# Patient Record
Sex: Female | Born: 1950 | Race: White | Hispanic: No | Marital: Married | Smoking: Never smoker
Health system: Southern US, Community
[De-identification: ages and names within clinical notes are randomized; demographics above are authoritative.]

## PROBLEM LIST (undated history)

## (undated) DIAGNOSIS — R7989 Other specified abnormal findings of blood chemistry: Secondary | ICD-10-CM

## (undated) DIAGNOSIS — K589 Irritable bowel syndrome without diarrhea: Secondary | ICD-10-CM

## (undated) DIAGNOSIS — N3281 Overactive bladder: Secondary | ICD-10-CM

## (undated) DIAGNOSIS — K760 Fatty (change of) liver, not elsewhere classified: Secondary | ICD-10-CM

## (undated) DIAGNOSIS — I700 Atherosclerosis of aorta: Secondary | ICD-10-CM

## (undated) DIAGNOSIS — J189 Pneumonia, unspecified organism: Secondary | ICD-10-CM

## (undated) DIAGNOSIS — C4491 Basal cell carcinoma of skin, unspecified: Secondary | ICD-10-CM

## (undated) DIAGNOSIS — E538 Deficiency of other specified B group vitamins: Secondary | ICD-10-CM

## (undated) DIAGNOSIS — I480 Paroxysmal atrial fibrillation: Secondary | ICD-10-CM

## (undated) DIAGNOSIS — M109 Gout, unspecified: Secondary | ICD-10-CM

## (undated) DIAGNOSIS — Z9889 Other specified postprocedural states: Secondary | ICD-10-CM

## (undated) DIAGNOSIS — E0500 Thyrotoxicosis with diffuse goiter without thyrotoxic crisis or storm: Secondary | ICD-10-CM

## (undated) DIAGNOSIS — H269 Unspecified cataract: Secondary | ICD-10-CM

## (undated) DIAGNOSIS — N200 Calculus of kidney: Secondary | ICD-10-CM

## (undated) DIAGNOSIS — E785 Hyperlipidemia, unspecified: Secondary | ICD-10-CM

## (undated) DIAGNOSIS — R609 Edema, unspecified: Secondary | ICD-10-CM

## (undated) DIAGNOSIS — M62830 Muscle spasm of back: Secondary | ICD-10-CM

## (undated) DIAGNOSIS — Z87442 Personal history of urinary calculi: Secondary | ICD-10-CM

## (undated) DIAGNOSIS — R001 Bradycardia, unspecified: Secondary | ICD-10-CM

## (undated) DIAGNOSIS — I10 Essential (primary) hypertension: Secondary | ICD-10-CM

## (undated) DIAGNOSIS — K912 Postsurgical malabsorption, not elsewhere classified: Secondary | ICD-10-CM

## (undated) DIAGNOSIS — N183 Chronic kidney disease, stage 3 unspecified: Secondary | ICD-10-CM

## (undated) DIAGNOSIS — M1990 Unspecified osteoarthritis, unspecified site: Secondary | ICD-10-CM

## (undated) DIAGNOSIS — I209 Angina pectoris, unspecified: Secondary | ICD-10-CM

## (undated) DIAGNOSIS — I4891 Unspecified atrial fibrillation: Secondary | ICD-10-CM

## (undated) DIAGNOSIS — J383 Other diseases of vocal cords: Secondary | ICD-10-CM

## (undated) DIAGNOSIS — K90829 Short bowel syndrome, unspecified: Secondary | ICD-10-CM

## (undated) DIAGNOSIS — R011 Cardiac murmur, unspecified: Secondary | ICD-10-CM

## (undated) DIAGNOSIS — R7303 Prediabetes: Secondary | ICD-10-CM

## (undated) DIAGNOSIS — L719 Rosacea, unspecified: Secondary | ICD-10-CM

## (undated) DIAGNOSIS — I340 Nonrheumatic mitral (valve) insufficiency: Secondary | ICD-10-CM

## (undated) HISTORY — PX: APPENDECTOMY: SHX54

## (undated) HISTORY — PX: REPLACEMENT TOTAL KNEE BILATERAL: SUR1225

## (undated) HISTORY — PX: JOINT REPLACEMENT: SHX530

## (undated) HISTORY — PX: TOTAL KNEE ARTHROPLASTY: SHX125

## (undated) HISTORY — PX: COLON SURGERY: SHX602

## (undated) HISTORY — PX: DILATION AND CURETTAGE OF UTERUS: SHX78

---

## 1995-06-28 HISTORY — PX: VAGINAL HYSTERECTOMY: SUR661

## 1995-06-28 HISTORY — PX: TOTAL VAGINAL HYSTERECTOMY: SHX2548

## 2004-11-30 ENCOUNTER — Ambulatory Visit: Payer: Self-pay | Admitting: Internal Medicine

## 2005-04-29 ENCOUNTER — Ambulatory Visit: Payer: Self-pay | Admitting: Internal Medicine

## 2005-12-14 ENCOUNTER — Ambulatory Visit: Payer: Self-pay | Admitting: Internal Medicine

## 2006-12-19 ENCOUNTER — Ambulatory Visit: Payer: Self-pay | Admitting: Internal Medicine

## 2006-12-26 ENCOUNTER — Ambulatory Visit: Payer: Self-pay | Admitting: Gastroenterology

## 2007-12-25 ENCOUNTER — Ambulatory Visit: Payer: Self-pay | Admitting: Internal Medicine

## 2008-07-31 ENCOUNTER — Observation Stay: Payer: Self-pay | Admitting: Internal Medicine

## 2008-12-25 ENCOUNTER — Ambulatory Visit: Payer: Self-pay | Admitting: Internal Medicine

## 2010-01-04 ENCOUNTER — Ambulatory Visit: Payer: Self-pay | Admitting: Internal Medicine

## 2010-10-27 ENCOUNTER — Ambulatory Visit: Payer: Self-pay | Admitting: Specialist

## 2010-12-27 ENCOUNTER — Emergency Department: Payer: Self-pay | Admitting: Emergency Medicine

## 2011-02-08 ENCOUNTER — Ambulatory Visit: Payer: Self-pay | Admitting: Internal Medicine

## 2012-04-10 ENCOUNTER — Ambulatory Visit: Payer: Self-pay | Admitting: Internal Medicine

## 2012-06-13 ENCOUNTER — Ambulatory Visit: Payer: Self-pay | Admitting: Urology

## 2012-06-28 ENCOUNTER — Ambulatory Visit: Payer: Self-pay | Admitting: Urology

## 2012-07-09 ENCOUNTER — Ambulatory Visit: Payer: Self-pay | Admitting: Urology

## 2012-07-17 ENCOUNTER — Ambulatory Visit: Payer: Self-pay | Admitting: Urology

## 2012-07-24 ENCOUNTER — Ambulatory Visit: Payer: Self-pay | Admitting: Urology

## 2012-10-26 ENCOUNTER — Ambulatory Visit: Payer: Self-pay | Admitting: Urology

## 2012-11-20 ENCOUNTER — Ambulatory Visit: Payer: Self-pay | Admitting: Specialist

## 2012-11-20 LAB — URINALYSIS, COMPLETE
Bilirubin,UR: NEGATIVE
Ph: 5 (ref 4.5–8.0)
Protein: NEGATIVE
RBC,UR: 4 /HPF (ref 0–5)
Specific Gravity: 1.028 (ref 1.003–1.030)
Squamous Epithelial: 1
WBC UR: 2 /HPF (ref 0–5)

## 2012-11-20 LAB — BASIC METABOLIC PANEL
BUN: 23 mg/dL — ABNORMAL HIGH (ref 7–18)
Calcium, Total: 9.4 mg/dL (ref 8.5–10.1)
Co2: 29 mmol/L (ref 21–32)
EGFR (African American): >60
Osmolality: 277 (ref 275–301)
Potassium: 4 mmol/L (ref 3.5–5.1)

## 2012-11-20 LAB — CBC
HCT: 38.4 % (ref 35.0–47.0)
HGB: 13.3 g/dL (ref 12.0–16.0)
MCHC: 34.7 g/dL (ref 32.0–36.0)
Platelet: 238 10*3/uL (ref 150–440)
RDW: 13.4 % (ref 11.5–14.5)

## 2012-11-20 LAB — PROTIME-INR: INR: 0.9

## 2012-12-06 ENCOUNTER — Inpatient Hospital Stay: Payer: Self-pay | Admitting: Specialist

## 2012-12-07 LAB — CBC WITH DIFFERENTIAL/PLATELET
Basophil %: 0.7 %
Eosinophil #: 0.2 10*3/uL (ref 0.0–0.7)
HCT: 33.3 % — ABNORMAL LOW (ref 35.0–47.0)
HGB: 11.4 g/dL — ABNORMAL LOW (ref 12.0–16.0)
Lymphocyte #: 2.5 10*3/uL (ref 1.0–3.6)
Lymphocyte %: 32.8 %
MCH: 31.2 pg (ref 26.0–34.0)
MCHC: 34.4 g/dL (ref 32.0–36.0)
Monocyte #: 0.8 x10 3/mm (ref 0.2–0.9)
Monocyte %: 10.4 %
Neutrophil #: 4 10*3/uL (ref 1.4–6.5)
Neutrophil %: 53.9 %
RBC: 3.67 10*6/uL — ABNORMAL LOW (ref 3.80–5.20)
WBC: 7.5 10*3/uL (ref 3.6–11.0)

## 2012-12-07 LAB — BASIC METABOLIC PANEL
BUN: 11 mg/dL (ref 7–18)
Calcium, Total: 8.3 mg/dL — ABNORMAL LOW (ref 8.5–10.1)
Co2: 27 mmol/L (ref 21–32)
EGFR (African American): >60
Glucose: 91 mg/dL (ref 65–99)
Osmolality: 277 (ref 275–301)
Potassium: 3.8 mmol/L (ref 3.5–5.1)
Sodium: 139 mmol/L (ref 136–145)

## 2013-05-08 ENCOUNTER — Ambulatory Visit: Payer: Self-pay | Admitting: Internal Medicine

## 2013-05-17 ENCOUNTER — Ambulatory Visit: Payer: Self-pay | Admitting: Internal Medicine

## 2013-07-02 ENCOUNTER — Ambulatory Visit: Payer: Self-pay | Admitting: Urology

## 2013-11-14 ENCOUNTER — Ambulatory Visit: Payer: Self-pay | Admitting: Internal Medicine

## 2014-06-02 ENCOUNTER — Ambulatory Visit: Payer: Self-pay | Admitting: Internal Medicine

## 2014-10-17 NOTE — Discharge Summary (Signed)
PATIENT NAME:  Emily Velazquez, Emily Velazquez MR#:  [id number] DATE OF BIRTH:  08/18/1950  DATE OF ADMISSION:  12/06/2012 DATE OF DISCHARGE:  12/09/2012  FINAL DIAGNOSES:  1. Advanced osteoarthritis, left knee.  2. Hypertension and osteoarthritis.   OPERATION: On 12/06/2012, cemented left DePuy rotating platform total knee replacement.   COMPLICATIONS: None.   CONSULTATIONS: None.   DISCHARGE MEDICATIONS: Enteric-coated aspirin 1 p.o. b.i.d., Norco 5/325 p.r.n. pain, home medications as prior to admission.   HISTORY: The patient is a 64 year old female with advanced osteoarthritis of the left knee. It has been coming on for many years. She has had long-term conservative treatment with NSAIDs, cortisone injections, hyaluronic acid injections, walking aids, rest and exercise. X-ray showed advanced arthritis of the knee with medial joint narrowing, sclerosis, cyst formation and patellofemoral spurring. 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 She wished to proceed with left total knee replacement. The risks of surgery of postop complications were discussed with the patient and her husband at length.   PAST MEDICAL HISTORY: As above.   ALLERGIES: SHELLFISH, UNKNOWN. CODEINE AND SULFA DRUGS CAUSE ITCHING.   HOME MEDICATIONS: Oxycodone as needed, Zolpidem 5 mg daily amlodipine 10 mg daily, Zyrtec 10 mg daily, fluticasone 50 mcg nasal spray once a day, atenolol 100 mg once a day, Diovan 1 p.o. daily, estradiol 0.5 mg daily, Relafen twice a day 750 mg, Norco as needed.   REVIEW OF SYSTEMS: Unremarkable.   FAMILY HISTORY: Unremarkable.   SOCIAL HISTORY: The patient does not smoke or drink. She lives at home with her husband.   EXAM:  GENERAL: Healthy female in no distress.  VITAL SIGNS: Normal.  EXTREMITIES: The hips showed normal motion. Right total knee replacement had good motion without pain and good stability. The left knee showed varus deformity with medial joint line pain.  Motion was 10 to 90 degrees. Neurovascular status was good distally. The skin was intact.   LABORATORY DATA: On admission was satisfactory.   HOSPITAL COURSE: The patient underwent left total knee replacement on 12/06/2012. She did well postoperatively. Hemoglobin remained stable, and she was ambulated. She was stable and ready for home discharge on 12/09/2012. She will get home physical therapy. She is partial weightbearing. She will be seen in my office in 2 weeks for exam.    ____________________________ Park Breed, MD hem:gb D: 12/25/2012 18:57:19 ET T: 12/25/2012 21:18:44 ET JOB#: [id number]  cc: Park Breed, MD, <Dictator> Tracie Harrier, MD Park Breed MD ELECTRONICALLY SIGNED 12/26/2012 12:52

## 2014-10-17 NOTE — Op Note (Signed)
PATIENT NAME:  Emily Velazquez, HOLLYWOOD MR#:  [id number] DATE OF BIRTH:  [birth year]  DATE OF PROCEDURE:  12/06/2012  PREOPERATIVE DIAGNOSIS: Advanced osteoarthritis, left knee.   POSTOPERATIVE DIAGNOSIS: Advanced osteoarthritis, left knee.   PROCEDURE PERFORMED: Cemented left DePuy rotating platform total knee replacement (standard plus femur/patella, 10 mm polyethylene insert, #3 keeled tibial tray).   SURGEON: Park Breed, M.D.   ASSISTANT: Christophe Louis, M.D.   ANESTHESIA: Spinal plus femoral nerve block.   COMPLICATIONS: None.   DRAINS: Two Autovac.   ESTIMATED BLOOD LOSS:  25 mL.  REPLACEMENT: None.   DESCRIPTION OF PROCEDURE: The patient was brought to the operating room where she underwent satisfactory spinal anesthesia following a left femoral nerve block. She was placed on the operating table in the supine position and the left leg was prepped and draped in sterile fashion. She was noted to have at least a 10 degree flexion contracture. Esmarch was applied and the tourniquet was inflated to 350 mmHg. Tourniquet time was 96 minutes. An anterior midline incision was made and dissection carried out sharply through subcutaneous tissue. A medial arthrotomy was carried out and soft tissue debridement carried out. The tibial alignment jig was then put in place and the cutting block pinned. The proximal tibial cut was made. This was in good alignment. The femur was sized as a standard plus and the centering hole made here after the ligaments were balanced. Minimal release was done medially. The anterior cutting block was inserted and the femoral rotation guide was put in place and alignment was felt to be excellent. The cutting block was pinned and the anterior and posterior cuts made. A 4 degree valgus left the distal femoral cutting block was put in place and pinned. The distal cut was made. Finishing guide was inserted and the finishing cuts made. The tibia was sized as a #3 and a  centering hole made. Keeled trial was inserted along with a 10 mm bearing. Standard plus femoral trial was inserted and the knee articulated well. She had full extension and good flexion. The patella was cut and drilled and the trial inserted. This tracked well also. Trials were removed and the joint thoroughly irrigated while the cement was mixed. A portion of the 0.25% Marcaine with epinephrine, morphine and Toradol was placed in the posterior capsule and the periosteum of the condyles while this was done.  The #3 keeled tibia, standard plus femur and standard plus patella were all cemented in place and a 10 mm rotating platform bearing was inserted. All excess cement was removed and the knee was held in full extension for 10 minutes. Final clean-up of debris was carried out. Thorough irrigation was carried out. The remainder of the epinephrine, morphine and Toradol was injected. Autovacs were inserted. The capsule was closed with #2 quill and the subcutaneous tissue was closed with zero quill after further irrigation. The skin was closed with staples. Dry sterile dressing along with TENS pads, Polar Care and knee immobilizer were applied. The tourniquet was deflated with good return of blood flow to the foot. Autovac was activated. The patient was transferred to her hospital bed and taken to recovery in good condition. ____________________________ Park Breed, MD hem:sb D: 12/06/2012 10:13:52 ET T: 12/06/2012 11:43:42 ET JOB#: [id number]  cc: Park Breed, MD, <Dictator> Park Breed MD ELECTRONICALLY SIGNED 12/07/2012 13:37

## 2014-10-17 NOTE — H&P (Signed)
   Subjective/Chief Complaint Left knee pain   History of Present Illness 64 year old female with severe progressive osteoarthritis left knee who has failed all conservative treatment including NSAIDs, cortisone shots, hyaluronic acid, walking aids, and rest. X-rays show advanced osteoarthritis of the knee with medial joint narrowing, sclerosis, and cyst formation with patellofemoral spurs.  She wishes to proceed with left total knee replacement as she had done on the right years ago.  Risks and benefits of surgery were discussed at length including but not limited to infection, non union, nerve or blood vessed damage, non union, need for repeat surgery, blood clots and lung emboli, and death.   Past Medical Health Hypertension, osteoarthritis   Primary Physician Hande   ALLERGIES:  Shellfish: Unknown  Codeine: Itching  Sulfa drugs: Itching  HOME MEDICATIONS: Medication Instructions Status  amlodipine 10 mg oral tablet 1 tab(s) orally once a day in am Active  Zyrtec 10 mg oral tablet 1 tab(s) orally once a day in am  Active  fluticasone 50 mcg/inh nasal spray 1 spray(s) nasal once a day, As Needed Active  atenolol 100 mg oral tablet 105 tab(s) orally once a day in am Active  Diovan 320 mg oral tablet 1 tab(s) orally once a day in am Active  estradiol 0.5 mg oral tablet 1 tab(s) orally once a day in am Active  nabumetone 750 mg oral tablet 1 tab(s) orally 2 times a day Active  hydrocodone acetaminophen 5 - 325 1 tab oral BID Active   Family and Social History:  Family History Non-Contributory   Social History negative tobacco   Place of Living Home   Review of Systems:  Fever/Chills No   Cough No   Sputum No   Abdominal Pain No   Physical Exam:  GEN well developed, well nourished, no acute distress   HEENT pink conjunctivae   NECK supple   RESP normal resp effort   CARD regular rate   ABD denies tenderness   LYMPH negative neck   EXTR negative edema, Left knee  range of motion 5-95*,  Tender medial joint line.  slight effusion.  stable ligaments.  circulation/sensation/motor function good.   SKIN normal to palpation   NEURO motor/sensory function intact   PSYCH alert, A+O to time, place, person, good insight    Assessment/Admission Diagnosis Advanced osteoarthritis left knee   Plan Left total knee replacement   Electronic Signatures: Park Breed (MD)  (Signed 11-Jun-14 14:57)  Authored: CHIEF COMPLAINT and HISTORY, ALLERGIES, HOME MEDICATIONS, FAMILY AND SOCIAL HISTORY, REVIEW OF SYSTEMS, PHYSICAL EXAM, ASSESSMENT AND PLAN   Last Updated: 11-Jun-14 14:57 by Park Breed (MD)

## 2015-04-21 ENCOUNTER — Other Ambulatory Visit: Payer: Self-pay | Admitting: Internal Medicine

## 2015-04-21 DIAGNOSIS — Z8742 Personal history of other diseases of the female genital tract: Secondary | ICD-10-CM

## 2015-04-21 DIAGNOSIS — R921 Mammographic calcification found on diagnostic imaging of breast: Secondary | ICD-10-CM

## 2015-04-21 DIAGNOSIS — Z87898 Personal history of other specified conditions: Secondary | ICD-10-CM

## 2015-06-04 ENCOUNTER — Other Ambulatory Visit: Payer: Self-pay

## 2015-06-04 ENCOUNTER — Ambulatory Visit: Payer: Self-pay

## 2015-06-12 ENCOUNTER — Ambulatory Visit
Admission: RE | Admit: 2015-06-12 | Discharge: 2015-06-12 | Disposition: A | Discharge status: Home / Self Care | Payer: Medicare Other | Source: Ambulatory Visit | Attending: Internal Medicine | Admitting: Internal Medicine

## 2015-06-12 ENCOUNTER — Other Ambulatory Visit: Payer: Self-pay | Admitting: Internal Medicine

## 2015-06-12 DIAGNOSIS — Z8742 Personal history of other diseases of the female genital tract: Secondary | ICD-10-CM

## 2015-06-12 DIAGNOSIS — Z87898 Personal history of other specified conditions: Secondary | ICD-10-CM

## 2015-06-12 DIAGNOSIS — R921 Mammographic calcification found on diagnostic imaging of breast: Secondary | ICD-10-CM

## 2015-06-12 DIAGNOSIS — D242 Benign neoplasm of left breast: Secondary | ICD-10-CM | POA: Diagnosis not present

## 2016-05-30 ENCOUNTER — Other Ambulatory Visit: Payer: Self-pay | Admitting: Internal Medicine

## 2016-05-30 DIAGNOSIS — Z1231 Encounter for screening mammogram for malignant neoplasm of breast: Secondary | ICD-10-CM

## 2016-06-02 ENCOUNTER — Ambulatory Visit: Payer: Medicare Other

## 2016-07-05 ENCOUNTER — Ambulatory Visit: Payer: Medicare Other

## 2016-08-02 ENCOUNTER — Ambulatory Visit
Admission: RE | Admit: 2016-08-02 | Discharge: 2016-08-02 | Disposition: A | Discharge status: Home / Self Care | Payer: Medicare Other | Source: Ambulatory Visit | Attending: Internal Medicine | Admitting: Internal Medicine

## 2016-08-02 ENCOUNTER — Encounter: Payer: Self-pay | Admitting: Radiology

## 2016-08-02 DIAGNOSIS — Z1231 Encounter for screening mammogram for malignant neoplasm of breast: Secondary | ICD-10-CM | POA: Insufficient documentation

## 2016-10-25 ENCOUNTER — Encounter: Payer: Self-pay | Admitting: *Deleted

## 2016-10-25 NOTE — H&P (Signed)
See scanned note.

## 2016-10-26 ENCOUNTER — Ambulatory Visit: Payer: Medicare Other | Admitting: Anesthesiology

## 2016-10-26 ENCOUNTER — Ambulatory Visit
Admission: RE | Admit: 2016-10-26 | Discharge: 2016-10-26 | Disposition: A | Discharge status: Home / Self Care | Payer: Medicare Other | Source: Ambulatory Visit | Attending: Ophthalmology | Admitting: Ophthalmology

## 2016-10-26 ENCOUNTER — Encounter
Admission: RE | Disposition: A | Discharge status: Home / Self Care | Payer: Self-pay | Source: Ambulatory Visit | Attending: Ophthalmology

## 2016-10-26 DIAGNOSIS — H2511 Age-related nuclear cataract, right eye: Secondary | ICD-10-CM | POA: Diagnosis not present

## 2016-10-26 DIAGNOSIS — M109 Gout, unspecified: Secondary | ICD-10-CM | POA: Diagnosis not present

## 2016-10-26 DIAGNOSIS — I10 Essential (primary) hypertension: Secondary | ICD-10-CM | POA: Diagnosis not present

## 2016-10-26 DIAGNOSIS — G709 Myoneural disorder, unspecified: Secondary | ICD-10-CM | POA: Insufficient documentation

## 2016-10-26 HISTORY — DX: Essential (primary) hypertension: I10

## 2016-10-26 HISTORY — DX: Personal history of urinary calculi: Z87.442

## 2016-10-26 HISTORY — DX: Rosacea, unspecified: L71.9

## 2016-10-26 HISTORY — DX: Edema, unspecified: R60.9

## 2016-10-26 HISTORY — DX: Gout, unspecified: M10.9

## 2016-10-26 HISTORY — DX: Cardiac murmur, unspecified: R01.1

## 2016-10-26 HISTORY — DX: Overactive bladder: N32.81

## 2016-10-26 HISTORY — PX: CATARACT EXTRACTION W/PHACO: SHX586

## 2016-10-26 HISTORY — DX: Irritable bowel syndrome, unspecified: K58.9

## 2016-10-26 SURGERY — PHACOEMULSIFICATION, CATARACT, WITH IOL INSERTION
Anesthesia: Monitor Anesthesia Care | Site: Eye | Laterality: Right | Wound class: Clean

## 2016-10-26 MED ORDER — MOXIFLOXACIN HCL 0.5 % OP SOLN
1.0000 [drp] | OPHTHALMIC | Status: AC
  Administered 2016-10-26 (×3): 1 [drp] via OPHTHALMIC

## 2016-10-26 MED ORDER — NA CHONDROIT SULF-NA HYALURON 40-17 MG/ML IO SOLN
INTRAOCULAR | Status: AC
  Filled 2016-10-26: qty 1

## 2016-10-26 MED ORDER — EPINEPHRINE PF 1 MG/ML IJ SOLN
INTRAMUSCULAR | Status: AC
  Filled 2016-10-26: qty 2

## 2016-10-26 MED ORDER — CEFUROXIME OPHTHALMIC INJECTION 1 MG/0.1 ML
INJECTION | OPHTHALMIC | Status: DC | PRN
  Administered 2016-10-26: .1 mL via INTRACAMERAL

## 2016-10-26 MED ORDER — MOXIFLOXACIN HCL 0.5 % OP SOLN
OPHTHALMIC | Status: DC | PRN
  Administered 2016-10-26: 1 [drp] via OPHTHALMIC

## 2016-10-26 MED ORDER — BSS IO SOLN
INTRAOCULAR | Status: DC | PRN
  Administered 2016-10-26: 1 mL via OPHTHALMIC

## 2016-10-26 MED ORDER — POVIDONE-IODINE 5 % OP SOLN
OPHTHALMIC | Status: DC | PRN
  Administered 2016-10-26: 1 via OPHTHALMIC

## 2016-10-26 MED ORDER — CARBACHOL 0.01 % IO SOLN
INTRAOCULAR | Status: DC | PRN
  Administered 2016-10-26: .5 mL via INTRAOCULAR

## 2016-10-26 MED ORDER — TETRACAINE HCL 0.5 % OP SOLN
OPHTHALMIC | Status: DC | PRN
  Administered 2016-10-26: 1 [drp] via OPHTHALMIC

## 2016-10-26 MED ORDER — ALFENTANIL 500 MCG/ML IJ INJ
INJECTION | INTRAVENOUS | Status: DC | PRN
  Administered 2016-10-26: 1000 ug via INTRAVENOUS

## 2016-10-26 MED ORDER — PHENYLEPHRINE HCL 10 % OP SOLN
OPHTHALMIC | Status: AC
  Administered 2016-10-26: 1 [drp] via OPHTHALMIC
  Filled 2016-10-26: qty 5

## 2016-10-26 MED ORDER — SODIUM CHLORIDE 0.9 % IV SOLN
INTRAVENOUS | Status: DC
  Administered 2016-10-26: 08:00:00 via INTRAVENOUS

## 2016-10-26 MED ORDER — LIDOCAINE HCL (PF) 4 % IJ SOLN
INTRAOCULAR | Status: DC | PRN
  Administered 2016-10-26: 2 mL via OPHTHALMIC

## 2016-10-26 MED ORDER — LIDOCAINE HCL (PF) 4 % IJ SOLN
INTRAMUSCULAR | Status: DC | PRN
  Administered 2016-10-26: 4 mL via OPHTHALMIC

## 2016-10-26 MED ORDER — PHENYLEPHRINE HCL 10 % OP SOLN
1.0000 [drp] | OPHTHALMIC | Status: DC
  Administered 2016-10-26 (×4): 1 [drp] via OPHTHALMIC

## 2016-10-26 MED ORDER — LIDOCAINE HCL (PF) 4 % IJ SOLN
INTRAMUSCULAR | Status: AC
  Filled 2016-10-26: qty 5

## 2016-10-26 MED ORDER — CYCLOPENTOLATE HCL 2 % OP SOLN
OPHTHALMIC | Status: AC
  Administered 2016-10-26: 1 [drp] via OPHTHALMIC
  Filled 2016-10-26: qty 2

## 2016-10-26 MED ORDER — TETRACAINE HCL 0.5 % OP SOLN
OPHTHALMIC | Status: AC
  Filled 2016-10-26: qty 2

## 2016-10-26 MED ORDER — CEFUROXIME OPHTHALMIC INJECTION 1 MG/0.1 ML
INJECTION | OPHTHALMIC | Status: AC
  Filled 2016-10-26: qty 0.1

## 2016-10-26 MED ORDER — BUPIVACAINE HCL (PF) 0.75 % IJ SOLN
INTRAMUSCULAR | Status: AC
  Filled 2016-10-26: qty 10

## 2016-10-26 MED ORDER — NA CHONDROIT SULF-NA HYALURON 40-17 MG/ML IO SOLN
INTRAOCULAR | Status: DC | PRN
  Administered 2016-10-26: 1 mL via INTRAOCULAR

## 2016-10-26 MED ORDER — HYALURONIDASE HUMAN 150 UNIT/ML IJ SOLN
INTRAMUSCULAR | Status: AC
  Filled 2016-10-26: qty 1

## 2016-10-26 MED ORDER — MOXIFLOXACIN HCL 0.5 % OP SOLN
OPHTHALMIC | Status: AC
  Administered 2016-10-26: 1 [drp] via OPHTHALMIC
  Filled 2016-10-26: qty 3

## 2016-10-26 MED ORDER — POVIDONE-IODINE 5 % OP SOLN
OPHTHALMIC | Status: AC
  Filled 2016-10-26: qty 30

## 2016-10-26 MED ORDER — CYCLOPENTOLATE HCL 2 % OP SOLN
1.0000 [drp] | OPHTHALMIC | Status: AC
  Administered 2016-10-26 (×4): 1 [drp] via OPHTHALMIC

## 2016-10-26 SURGICAL SUPPLY — 30 items
CANNULA ANT/CHMB 27GA (MISCELLANEOUS) ×3 IMPLANT
CORD BIP STRL DISP 12FT (MISCELLANEOUS) ×3 IMPLANT
CUP MEDICINE 2OZ PLAST GRAD ST (MISCELLANEOUS) ×3 IMPLANT
DRAPE XRAY CASSETTE 23X24 (DRAPES) ×3 IMPLANT
ERASER HMR WETFIELD 18G (MISCELLANEOUS) ×3 IMPLANT
GLOVE BIO SURGEON STRL SZ8 (GLOVE) ×3 IMPLANT
GLOVE SURG LX 6.5 MICRO (GLOVE) ×2
GLOVE SURG LX 8.0 MICRO (GLOVE) ×2
GLOVE SURG LX STRL 6.5 MICRO (GLOVE) ×1 IMPLANT
GLOVE SURG LX STRL 8.0 MICRO (GLOVE) ×1 IMPLANT
GOWN STRL REUS W/ TWL LRG LVL3 (GOWN DISPOSABLE) ×1 IMPLANT
GOWN STRL REUS W/ TWL XL LVL3 (GOWN DISPOSABLE) ×1 IMPLANT
GOWN STRL REUS W/TWL LRG LVL3 (GOWN DISPOSABLE) ×2
GOWN STRL REUS W/TWL XL LVL3 (GOWN DISPOSABLE) ×2
LENS IOL ACRYSOF IQ 20.5 (Intraocular Lens) ×3 IMPLANT
PACK CATARACT (MISCELLANEOUS) ×3 IMPLANT
PACK CATARACT DINGLEDEIN LX (MISCELLANEOUS) ×3 IMPLANT
PACK EYE AFTER SURG (MISCELLANEOUS) ×3 IMPLANT
SHLD EYE VISITEC  UNIV (MISCELLANEOUS) ×3 IMPLANT
SOL BSS BAG (MISCELLANEOUS) ×3
SOL PREP PVP 2OZ (MISCELLANEOUS) ×3
SOLUTION BSS BAG (MISCELLANEOUS) ×1 IMPLANT
SOLUTION PREP PVP 2OZ (MISCELLANEOUS) ×1 IMPLANT
SUT ETHILON 10 0 CS140 6 (SUTURE) ×3 IMPLANT
SUT SILK 5-0 (SUTURE) ×3 IMPLANT
SYR 3ML LL SCALE MARK (SYRINGE) ×3 IMPLANT
SYR 5ML LL (SYRINGE) ×3 IMPLANT
SYR TB 1ML 27GX1/2 LL (SYRINGE) ×3 IMPLANT
WATER STERILE IRR 250ML POUR (IV SOLUTION) ×3 IMPLANT
WIPE NON LINTING 3.25X3.25 (MISCELLANEOUS) ×3 IMPLANT

## 2016-10-26 NOTE — Interval H&P Note (Signed)
History and Physical Interval Note:  10/26/2016 10:27 AM  Emily Velazquez  has presented today for surgery, with the diagnosis of CARARACT  The various methods of treatment have been discussed with the patient and family. After consideration of risks, benefits and other options for treatment, the patient has consented to  Procedure(s): CATARACT EXTRACTION PHACO AND INTRAOCULAR LENS PLACEMENT ([place]) (Right) as a surgical intervention .  The patient's history has been reviewed, patient examined, no change in status, stable for surgery.  I have reviewed the patient's chart and labs.  Questions were answered to the patient's satisfaction.     Loray Akard

## 2016-10-26 NOTE — Op Note (Signed)
Date of Surgery: 10/26/2016 Date of Dictation: 10/26/2016 11:16 AM Pre-operative Diagnosis:  Nuclear Sclerotic Cataract right Eye Post-operative Diagnosis: same Procedure performed: Extra-capsular Cataract Extraction (ECCE) with placement of a posterior chamber intraocular lens (IOL) right Eye IOL: * No implants in log * Anesthesia: 2% Lidocaine and 4% Marcaine in a 50/50 mixture with 10 unites/ml of Hylenex given as a peribulbar Anesthesiologist: Anesthesiologist: Andria Frames, MD CRNA: Bernardo Heater, CRNA Complications: none Estimated Blood Loss: less than 1 ml  Description of procedure:  The patient was given anesthesia and sedation via intravenous access. The patient was then prepped and draped in the usual fashion. A 25-gauge needle was bent for initiating the capsulorhexis. A 5-0 silk suture was placed through the conjunctiva superior and inferiorly to serve as bridle sutures. Hemostasis was obtained at the superior limbus using an eraser cautery. A partial thickness groove was made at the anterior surgical limbus with a 64 Beaver blade and this was dissected anteriorly with an [employer]aya. The anterior chamber was entered at 10 o'clock with a 1.0 mm paracentesis knife and through the lamellar dissection with a 2.6 mm Alcon keratome. Epi-Shugarcaine 0.5 CC [9 cc BSS Plus (Alcon), 3 cc 4% preservative-free lidocaine ([place]) and 4 cc 1:1000 preservative-free, bisulfite-free epinephrine] was injected into the anterior chamber via the paracentesis tract. Epi-Shugarcaine 0.5 CC [9 cc BSS Plus (Alcon), 3 cc 4% preservative-free lidocaine (Hospira) and 4 cc 1:1000 preservative-free, bisulfite-free epinephrine] was injected into the anterior chamber via the paracentesis tract. DiscoVisc was injected to replace the aqueous and a continuous tear curvilinear capsulorhexis was performed using a bent 25-gauge needle.  Balance salt on a syringe was used to perform hydro-dissection and  phacoemulsification was carried out using a divide and conquer technique. Procedure(s) with comments: CATARACT EXTRACTION PHACO AND INTRAOCULAR LENS PLACEMENT ([facility]) suture placed in right eye at end of procedure (Right) - [place]  01:21 AP% 23.7 CDE 36.24 Fluid pack lot # [id number]. Irrigation/aspiration was used to remove the residual cortex and the capsular bag was inflated with DiscoVisc. The intraocular lens was inserted into the capsular bag using a pre-loaded UltraSert Delivery System. Irrigation/aspiration was used to remove the residual DiscoVisc. The wound was inflated with balanced salt and checked for leaks. None were found. Miostat was injected via the paracentesis track and 0.1 ml of cefuroxime containing 1 mg of drug  was injected via the paracentesis track. The wound was checked for leaks again and none were found.   The bridal sutures were removed and two drops of Vigamox were placed on the eye. An eye shield was placed to protect the eye and the patient was discharged to the recovery area in good condition.   Demitrious Mccannon MD

## 2016-10-26 NOTE — Transfer of Care (Signed)
Immediate Anesthesia Transfer of Care Note  Patient: Emily Velazquez  Procedure(s) Performed: Procedure(s) with comments: CATARACT EXTRACTION PHACO AND INTRAOCULAR LENS PLACEMENT ([place]) suture placed in right eye at end of procedure (Right) - [place]  01:21 AP% 23.7 CDE 36.24 Fluid pack lot # [id number]  Patient Location: PACU  Anesthesia Type:MAC  Level of Consciousness: awake, alert  and oriented  Airway & Oxygen Therapy: Patient Spontanous Breathing and Patient connected to nasal cannula oxygen  Post-op Assessment: Report given to RN and Post -op Vital signs reviewed and stable  Post vital signs: Reviewed and stable  Last Vitals:  Vitals:   10/26/16 0814 10/26/16 1100  BP: (!) 162/69 135/67  Pulse: 64 (!) 58  Resp: 16 16  Temp: 37.3 C 36.6 C    Last Pain:  Vitals:   10/26/16 0814  TempSrc: Tympanic         Complications: No apparent anesthesia complications

## 2016-10-26 NOTE — Anesthesia Postprocedure Evaluation (Signed)
Anesthesia Post Note  Patient: Emily Velazquez  Procedure(s) Performed: Procedure(s) (LRB): CATARACT EXTRACTION PHACO AND INTRAOCULAR LENS PLACEMENT ([place]) suture placed in right eye at end of procedure (Right)  Patient location during evaluation: PACU Anesthesia Type: MAC Level of consciousness: awake and alert Pain management: pain level controlled Vital Signs Assessment: post-procedure vital signs reviewed and stable Respiratory status: spontaneous breathing, nonlabored ventilation, respiratory function stable and patient connected to nasal cannula oxygen Cardiovascular status: stable and blood pressure returned to baseline Anesthetic complications: no     Last Vitals:  Vitals:   10/26/16 0814 10/26/16 1100  BP: (!) 162/69 135/67  Pulse: 64 (!) 58  Resp: 16 16  Temp: 37.3 C 36.6 C    Last Pain:  Vitals:   10/26/16 0814  TempSrc: Tympanic                 Precious Haws Piscitello

## 2016-10-26 NOTE — OR Nursing (Signed)
Verified patient allergies and medication orders with  Dr Sandra Cockayne no new orders at this time.

## 2016-10-26 NOTE — Anesthesia Post-op Follow-up Note (Cosign Needed)
Anesthesia QCDR form completed.        

## 2016-10-26 NOTE — Anesthesia Preprocedure Evaluation (Signed)
Anesthesia Evaluation  Patient identified by MRN, date of birth, ID band Patient awake    Reviewed: Allergy & Precautions, H&P , NPO status , Patient's Chart, lab work & pertinent test results  History of Anesthesia Complications Negative for: history of anesthetic complications  Airway Mallampati: III  TM Distance: <3 FB Neck ROM: limited    Dental  (+) Chipped, Caps   Pulmonary neg pulmonary ROS, neg shortness of breath,    Pulmonary exam normal breath sounds clear to auscultation       Cardiovascular Exercise Tolerance: Good hypertension, (-) angina(-) Past MI Normal cardiovascular exam+ Valvular Problems/Murmurs  Rhythm:regular Rate:Normal     Neuro/Psych  Neuromuscular disease negative psych ROS   GI/Hepatic negative GI ROS, Neg liver ROS,   Endo/Other  negative endocrine ROS  Renal/GU      Musculoskeletal   Abdominal   Peds  Hematology negative hematology ROS (+)   Anesthesia Other Findings Past Medical History: No date: Edema No date: Gout No date: Heart murmur No date: History of kidney stones No date: Hypertension No date: IBS (irritable bowel syndrome) No date: OAB (overactive bladder) No date: Rosacea No date: Spasm     Comment: VOCAL CORD  Past Surgical History: No date: COLON SURGERY     Comment: RESECTION No date: JOINT REPLACEMENT     Comment: BIL TKR 1997: VAGINAL HYSTERECTOMY     Comment: complete  BMI    Body Mass Index:  35.70 kg/m      Reproductive/Obstetrics negative OB ROS                             Anesthesia Physical Anesthesia Plan  ASA: III  Anesthesia Plan: MAC   Post-op Pain Management:    Induction:   Airway Management Planned: Nasal Cannula  Additional Equipment:   Intra-op Plan:   Post-operative Plan:   Informed Consent: I have reviewed the patients History and Physical, chart, labs and discussed the procedure including the  risks, benefits and alternatives for the proposed anesthesia with the patient or authorized representative who has indicated his/her understanding and acceptance.     Plan Discussed with: Anesthesiologist, CRNA and Surgeon  Anesthesia Plan Comments:         Anesthesia Quick Evaluation

## 2016-10-26 NOTE — Discharge Instructions (Signed)
Eye Surgery Discharge Instructions  Expect mild scratchy sensation or mild soreness. DO NOT RUB YOUR EYE!  The day of surgery:  Minimal physical activity, but bed rest is not required  No reading, computer work, or close hand work  No bending, lifting, or straining.  May watch TV  For 24 hours:  No driving, legal decisions, or alcoholic beverages  Safety precautions  Eat anything you prefer: It is better to start with liquids, then soup then solid foods.  _____ Eye patch should be worn until postoperative exam tomorrow.  ____ Solar shield eyeglasses should be worn for comfort in the sunlight/patch while sleeping  Resume all regular medications including aspirin or Coumadin if these were discontinued prior to surgery. You may shower, bathe, shave, or wash your hair. Tylenol may be taken for mild discomfort.  Call your doctor if you experience significant pain, nausea, or vomiting, fever > 101 or other signs of infection. [phone] or [phone] Specific instructions:  Follow-up Information    Yenifer Saccente, MD Follow up.   Specialty:  Ophthalmology Why:  10-27-16 at 9:50 Contact information: [place] [address] [phone]          Eye Surgery Discharge Instructions  Expect mild scratchy sensation or mild soreness. DO NOT RUB YOUR EYE!  The day of surgery:  Minimal physical activity, but bed rest is not required  No reading, computer work, or close hand work  No bending, lifting, or straining.  May watch TV  For 24 hours:  No driving, legal decisions, or alcoholic beverages  Safety precautions  Eat anything you prefer: It is better to start with liquids, then soup then solid foods.  _____ Eye patch should be worn until postoperative exam tomorrow.  ____ Solar shield eyeglasses should be worn for comfort in the sunlight/patch while sleeping  Resume all regular medications including aspirin or Coumadin if these were  discontinued prior to surgery. You may shower, bathe, shave, or wash your hair. Tylenol may be taken for mild discomfort.  Call your doctor if you experience significant pain, nausea, or vomiting, fever > 101 or other signs of infection. [phone] or [phone] Specific instructions:  Follow-up Information    Miracle Criado, MD Follow up.   Specialty:  Ophthalmology Why:  10-27-16 at 9:50 Contact information: [address]   [place] [address] [phone]

## 2016-10-26 NOTE — Progress Notes (Signed)
No complaints of pain and eye shield dry

## 2017-04-11 DIAGNOSIS — I38 Endocarditis, valve unspecified: Secondary | ICD-10-CM

## 2017-04-11 HISTORY — DX: Endocarditis, valve unspecified: I38

## 2017-05-23 ENCOUNTER — Ambulatory Visit: Payer: Medicare Other | Admitting: Anesthesiology

## 2017-05-23 ENCOUNTER — Ambulatory Visit
Admission: RE | Admit: 2017-05-23 | Discharge: 2017-05-23 | Disposition: A | Discharge status: Home / Self Care | Payer: Medicare Other | Source: Ambulatory Visit | Attending: Internal Medicine | Admitting: Internal Medicine

## 2017-05-23 ENCOUNTER — Encounter: Payer: Self-pay | Admitting: *Deleted

## 2017-05-23 ENCOUNTER — Encounter
Admission: RE | Disposition: A | Discharge status: Home / Self Care | Payer: Self-pay | Source: Ambulatory Visit | Attending: Internal Medicine

## 2017-05-23 DIAGNOSIS — Z888 Allergy status to other drugs, medicaments and biological substances status: Secondary | ICD-10-CM | POA: Diagnosis not present

## 2017-05-23 DIAGNOSIS — Z9849 Cataract extraction status, unspecified eye: Secondary | ICD-10-CM | POA: Insufficient documentation

## 2017-05-23 DIAGNOSIS — Z79890 Hormone replacement therapy (postmenopausal): Secondary | ICD-10-CM | POA: Diagnosis not present

## 2017-05-23 DIAGNOSIS — J309 Allergic rhinitis, unspecified: Secondary | ICD-10-CM | POA: Diagnosis not present

## 2017-05-23 DIAGNOSIS — I480 Paroxysmal atrial fibrillation: Secondary | ICD-10-CM | POA: Diagnosis not present

## 2017-05-23 DIAGNOSIS — Z8249 Family history of ischemic heart disease and other diseases of the circulatory system: Secondary | ICD-10-CM | POA: Insufficient documentation

## 2017-05-23 DIAGNOSIS — Z881 Allergy status to other antibiotic agents status: Secondary | ICD-10-CM | POA: Insufficient documentation

## 2017-05-23 DIAGNOSIS — Z801 Family history of malignant neoplasm of trachea, bronchus and lung: Secondary | ICD-10-CM | POA: Diagnosis not present

## 2017-05-23 DIAGNOSIS — Z7901 Long term (current) use of anticoagulants: Secondary | ICD-10-CM | POA: Insufficient documentation

## 2017-05-23 DIAGNOSIS — E0500 Thyrotoxicosis with diffuse goiter without thyrotoxic crisis or storm: Secondary | ICD-10-CM | POA: Insufficient documentation

## 2017-05-23 DIAGNOSIS — Z87442 Personal history of urinary calculi: Secondary | ICD-10-CM | POA: Diagnosis not present

## 2017-05-23 DIAGNOSIS — Z882 Allergy status to sulfonamides status: Secondary | ICD-10-CM | POA: Diagnosis not present

## 2017-05-23 DIAGNOSIS — Z96653 Presence of artificial knee joint, bilateral: Secondary | ICD-10-CM | POA: Diagnosis not present

## 2017-05-23 DIAGNOSIS — Z9049 Acquired absence of other specified parts of digestive tract: Secondary | ICD-10-CM | POA: Diagnosis not present

## 2017-05-23 DIAGNOSIS — E538 Deficiency of other specified B group vitamins: Secondary | ICD-10-CM | POA: Diagnosis not present

## 2017-05-23 DIAGNOSIS — Z7951 Long term (current) use of inhaled steroids: Secondary | ICD-10-CM | POA: Insufficient documentation

## 2017-05-23 DIAGNOSIS — Z885 Allergy status to narcotic agent status: Secondary | ICD-10-CM | POA: Diagnosis not present

## 2017-05-23 DIAGNOSIS — N3281 Overactive bladder: Secondary | ICD-10-CM | POA: Diagnosis not present

## 2017-05-23 DIAGNOSIS — Z85828 Personal history of other malignant neoplasm of skin: Secondary | ICD-10-CM | POA: Diagnosis not present

## 2017-05-23 DIAGNOSIS — Z79899 Other long term (current) drug therapy: Secondary | ICD-10-CM | POA: Insufficient documentation

## 2017-05-23 DIAGNOSIS — M1990 Unspecified osteoarthritis, unspecified site: Secondary | ICD-10-CM | POA: Diagnosis not present

## 2017-05-23 DIAGNOSIS — Z90710 Acquired absence of both cervix and uterus: Secondary | ICD-10-CM | POA: Insufficient documentation

## 2017-05-23 DIAGNOSIS — I10 Essential (primary) hypertension: Secondary | ICD-10-CM | POA: Insufficient documentation

## 2017-05-23 HISTORY — PX: CARDIOVERSION: EP1203

## 2017-05-23 SURGERY — CARDIOVERSION (CATH LAB)
Anesthesia: General

## 2017-05-23 MED ORDER — PROPOFOL 10 MG/ML IV BOLUS
INTRAVENOUS | Status: AC
  Filled 2017-05-23: qty 20

## 2017-05-23 MED ORDER — METOPROLOL TARTRATE 100 MG PO TABS: 100.0000 mg | ORAL_TABLET | Freq: Two times a day (BID) | ORAL | 4 refills | Status: DC

## 2017-05-23 MED ORDER — ONDANSETRON HCL 4 MG/2ML IJ SOLN: 4.0000 mg | Freq: Once | INTRAMUSCULAR | Status: DC | PRN

## 2017-05-23 MED ORDER — SODIUM CHLORIDE 0.9 % IV SOLN
INTRAVENOUS | Status: DC
  Administered 2017-05-23: 07:00:00 via INTRAVENOUS

## 2017-05-23 MED ORDER — PROPOFOL 10 MG/ML IV BOLUS
INTRAVENOUS | Status: DC | PRN
  Administered 2017-05-23: 70 mg via INTRAVENOUS

## 2017-05-23 MED ORDER — FENTANYL CITRATE (PF) 100 MCG/2ML IJ SOLN: 25.0000 ug | INTRAMUSCULAR | Status: DC | PRN

## 2017-05-23 NOTE — Anesthesia Post-op Follow-up Note (Signed)
Anesthesia QCDR form completed.        

## 2017-05-23 NOTE — CV Procedure (Signed)
Electrical Cardioversion Procedure Note Emily Velazquez [id number] 09-04-1950  Procedure: Electrical Cardioversion Indications:  Atrial Fibrillation  Procedure Details Consent: Risks of procedure as well as the alternatives and risks of each were explained to the (patient/caregiver).  Consent for procedure obtained. Time Out: Verified patient identification, verified procedure, site/side was marked, verified correct patient position, special equipment/implants available, medications/allergies/relevent history reviewed, required imaging and test results available.  Performed  Patient placed on cardiac monitor, pulse oximetry, supplemental oxygen as necessary.  Sedation given: Short-acting barbiturates Pacer pads placed anterior and posterior chest.  Cardioverted 2 time(s).  Cardioverted at 150J.  Evaluation Findings: Post procedure EKG shows: NSR Complications: None Patient did tolerate procedure well.   [name] 05/23/2017, 7:34 AM

## 2017-05-23 NOTE — Transfer of Care (Signed)
Immediate Anesthesia Transfer of Care Note  Patient: Emily Velazquez  Procedure(s) Performed: CARDIOVERSION (N/A )  Patient Location: PACU  Anesthesia Type:General  Level of Consciousness: awake, alert  and oriented  Airway & Oxygen Therapy: Patient Spontanous Breathing and Patient connected to nasal cannula oxygen  Post-op Assessment: Report given to RN and Post -op Vital signs reviewed and stable  Post vital signs: Reviewed and stable  Last Vitals:  Vitals:   05/23/17 0737 05/23/17 0738  BP:  (!) 95/50  Pulse: (!) 51   Resp: 15 15  Temp:    SpO2: 94% 94%    Last Pain: There were no vitals filed for this visit.       Complications: No apparent anesthesia complications

## 2017-05-23 NOTE — Anesthesia Postprocedure Evaluation (Signed)
Anesthesia Post Note  Patient: Emily Velazquez  Procedure(s) Performed: CARDIOVERSION (N/A )  Patient location during evaluation: PACU Anesthesia Type: General Level of consciousness: awake and alert and oriented Pain management: pain level controlled Vital Signs Assessment: post-procedure vital signs reviewed and stable Respiratory status: spontaneous breathing Cardiovascular status: blood pressure returned to baseline Anesthetic complications: no     Last Vitals:  Vitals:   05/23/17 0802 05/23/17 0815  BP: 112/65 (!) 103/56  Pulse: (!) 54 (!) 51  Resp: 17 20  Temp:    SpO2: 98% 98%    Last Pain: There were no vitals filed for this visit.               Miro Balderson

## 2017-05-23 NOTE — Anesthesia Preprocedure Evaluation (Signed)
Anesthesia Evaluation  Patient identified by MRN, date of birth, ID band Patient awake    Reviewed: Allergy & Precautions, H&P , NPO status , Patient's Chart, lab work & pertinent test results, reviewed documented beta blocker date and time   History of Anesthesia Complications Negative for: history of anesthetic complications  Airway Mallampati: III  TM Distance: <3 FB Neck ROM: limited    Dental  (+) Chipped, Caps   Pulmonary neg pulmonary ROS, neg shortness of breath,    Pulmonary exam normal breath sounds clear to auscultation       Cardiovascular Exercise Tolerance: Good hypertension, (-) angina(-) Past MI Normal cardiovascular exam+ dysrhythmias + Valvular Problems/Murmurs  Rhythm:regular Rate:Normal     Neuro/Psych  Neuromuscular disease negative psych ROS   GI/Hepatic negative GI ROS, Neg liver ROS, IBS   Endo/Other  negative endocrine ROS  Renal/GU negative Renal ROS Bladder dysfunction      Musculoskeletal negative musculoskeletal ROS (+)   Abdominal   Peds negative pediatric ROS (+)  Hematology negative hematology ROS (+)   Anesthesia Other Findings Past Medical History: No date: Edema No date: Gout No date: Heart murmur No date: History of kidney stones No date: Hypertension No date: IBS (irritable bowel syndrome) No date: OAB (overactive bladder) No date: Rosacea No date: Spasm     Comment: VOCAL CORD  Past Surgical History: No date: COLON SURGERY     Comment: RESECTION No date: JOINT REPLACEMENT     Comment: BIL TKR 1997: VAGINAL HYSTERECTOMY     Comment: complete  BMI    Body Mass Index:  35.70 kg/m      Reproductive/Obstetrics negative OB ROS                             Anesthesia Physical  Anesthesia Plan  ASA: III  Anesthesia Plan: General   Post-op Pain Management:    Induction: Intravenous  PONV Risk Score and Plan:   Airway Management  Planned: Nasal Cannula  Additional Equipment:   Intra-op Plan:   Post-operative Plan:   Informed Consent: I have reviewed the patients History and Physical, chart, labs and discussed the procedure including the risks, benefits and alternatives for the proposed anesthesia with the patient or authorized representative who has indicated his/her understanding and acceptance.     Plan Discussed with: Anesthesiologist, CRNA and Surgeon  Anesthesia Plan Comments:         Anesthesia Quick Evaluation

## 2017-05-23 NOTE — Discharge Instructions (Signed)
Electrical Cardioversion, Care After  Patient has a follow up for around the 10th of December with Dr. Nehemiah Massed This sheet gives you information about how to care for yourself after your procedure. Your health care provider may also give you more specific instructions. If you have problems or questions, contact your health care provider. What can I expect after the procedure? After the procedure, it is common to have:  Some redness on the skin where the shocks were given.  Follow these instructions at home:  Do not drive for 24 hours if you were given a medicine to help you relax (sedative).  Take over-the-counter and prescription medicines only as told by your health care provider.  Ask your health care provider how to check your pulse. Check it often.  Rest for 48 hours after the procedure or as told by your health care provider.  Avoid or limit your caffeine use as told by your health care provider. Contact a health care provider if:  You feel like your heart is beating too quickly or your pulse is not regular.  You have a serious muscle cramp that does not go away. Get help right away if:  You have discomfort in your chest.  You are dizzy or you feel faint.  You have trouble breathing or you are short of breath.  Your speech is slurred.  You have trouble moving an arm or leg on one side of your body.  Your fingers or toes turn cold or blue. This information is not intended to replace advice given to you by your health care provider. Make sure you discuss any questions you have with your health care provider. Document Released: 04/03/2013 Document Revised: 01/15/2016 Document Reviewed: 12/18/2015 Elsevier Interactive Patient Education  [employer].

## 2017-06-22 ENCOUNTER — Encounter
Admission: RE | Disposition: A | Discharge status: Home / Self Care | Payer: Self-pay | Source: Ambulatory Visit | Attending: Internal Medicine

## 2017-06-22 ENCOUNTER — Ambulatory Visit: Payer: Medicare Other | Admitting: Certified Registered Nurse Anesthetist

## 2017-06-22 ENCOUNTER — Encounter: Payer: Self-pay | Admitting: *Deleted

## 2017-06-22 ENCOUNTER — Ambulatory Visit
Admission: RE | Admit: 2017-06-22 | Discharge: 2017-06-22 | Disposition: A | Discharge status: Home / Self Care | Payer: Medicare Other | Source: Ambulatory Visit | Attending: Internal Medicine | Admitting: Internal Medicine

## 2017-06-22 DIAGNOSIS — I10 Essential (primary) hypertension: Secondary | ICD-10-CM | POA: Diagnosis not present

## 2017-06-22 DIAGNOSIS — Z90710 Acquired absence of both cervix and uterus: Secondary | ICD-10-CM | POA: Diagnosis not present

## 2017-06-22 DIAGNOSIS — Z538 Procedure and treatment not carried out for other reasons: Secondary | ICD-10-CM | POA: Diagnosis not present

## 2017-06-22 DIAGNOSIS — Z96653 Presence of artificial knee joint, bilateral: Secondary | ICD-10-CM | POA: Diagnosis not present

## 2017-06-22 DIAGNOSIS — N3281 Overactive bladder: Secondary | ICD-10-CM | POA: Diagnosis not present

## 2017-06-22 DIAGNOSIS — I4891 Unspecified atrial fibrillation: Secondary | ICD-10-CM | POA: Insufficient documentation

## 2017-06-22 DIAGNOSIS — M109 Gout, unspecified: Secondary | ICD-10-CM | POA: Insufficient documentation

## 2017-06-22 DIAGNOSIS — K589 Irritable bowel syndrome without diarrhea: Secondary | ICD-10-CM | POA: Diagnosis not present

## 2017-06-22 DIAGNOSIS — R011 Cardiac murmur, unspecified: Secondary | ICD-10-CM | POA: Insufficient documentation

## 2017-06-22 HISTORY — PX: CARDIOVERSION: EP1203

## 2017-06-22 SURGERY — CARDIOVERSION (CATH LAB)
Anesthesia: General

## 2017-06-22 MED ORDER — SODIUM CHLORIDE 0.9 % IV SOLN: INTRAVENOUS | Status: DC

## 2017-06-22 NOTE — Addendum Note (Signed)
Addendum  created 06/22/17 7218 by Johnna Acosta, CRNA   Charge Capture section accepted

## 2017-06-22 NOTE — Anesthesia Preprocedure Evaluation (Signed)
Anesthesia Evaluation  Patient identified by MRN, date of birth, ID band Patient awake    Reviewed: Allergy & Precautions, H&P , NPO status , Patient's Chart, lab work & pertinent test results, reviewed documented beta blocker date and time   History of Anesthesia Complications Negative for: history of anesthetic complications  Airway Mallampati: III  TM Distance: <3 FB Neck ROM: limited    Dental  (+) Chipped, Caps   Pulmonary neg pulmonary ROS, neg shortness of breath,    Pulmonary exam normal breath sounds clear to auscultation       Cardiovascular Exercise Tolerance: Good hypertension, (-) angina(-) Past MI Normal cardiovascular exam+ dysrhythmias + Valvular Problems/Murmurs  Rhythm:regular Rate:Normal     Neuro/Psych  Neuromuscular disease negative psych ROS   GI/Hepatic negative GI ROS, Neg liver ROS, IBS   Endo/Other  negative endocrine ROS  Renal/GU negative Renal ROS Bladder dysfunction      Musculoskeletal negative musculoskeletal ROS (+)   Abdominal   Peds negative pediatric ROS (+)  Hematology negative hematology ROS (+)   Anesthesia Other Findings Past Medical History: No date: Edema No date: Gout No date: Heart murmur No date: History of kidney stones No date: Hypertension No date: IBS (irritable bowel syndrome) No date: OAB (overactive bladder) No date: Rosacea No date: Spasm     Comment: VOCAL CORD  Past Surgical History: No date: COLON SURGERY     Comment: RESECTION No date: JOINT REPLACEMENT     Comment: BIL TKR 1997: VAGINAL HYSTERECTOMY     Comment: complete  BMI    Body Mass Index:  35.70 kg/m      Reproductive/Obstetrics negative OB ROS                             Anesthesia Physical  Anesthesia Plan  ASA: III  Anesthesia Plan: General   Post-op Pain Management:    Induction: Intravenous  PONV Risk Score and Plan:   Airway Management  Planned: Nasal Cannula  Additional Equipment:   Intra-op Plan:   Post-operative Plan:   Informed Consent: I have reviewed the patients History and Physical, chart, labs and discussed the procedure including the risks, benefits and alternatives for the proposed anesthesia with the patient or authorized representative who has indicated his/her understanding and acceptance.   Dental Advisory Given  Plan Discussed with: Anesthesiologist, CRNA and Surgeon  Anesthesia Plan Comments: (Patient consented for risks of anesthesia including but not limited to:  - adverse reactions to medications - risk of intubation if required - damage to teeth, lips or other oral mucosa - sore throat or hoarseness - Damage to heart, brain, lungs or loss of life  Patient voiced understanding.)        Anesthesia Quick Evaluation

## 2017-07-13 ENCOUNTER — Other Ambulatory Visit: Payer: Self-pay | Admitting: Internal Medicine

## 2017-07-13 DIAGNOSIS — Z1239 Encounter for other screening for malignant neoplasm of breast: Secondary | ICD-10-CM

## 2017-07-26 NOTE — Op Note (Signed)
The procedure was not done due to patient's spontaneous conversion to normal sinus rhythm

## 2017-08-24 ENCOUNTER — Encounter
Admission: RE | Disposition: A | Discharge status: Home / Self Care | Payer: Self-pay | Source: Ambulatory Visit | Attending: Internal Medicine

## 2017-08-24 ENCOUNTER — Encounter: Payer: Self-pay | Admitting: *Deleted

## 2017-08-24 ENCOUNTER — Ambulatory Visit
Admission: RE | Admit: 2017-08-24 | Discharge: 2017-08-24 | Disposition: A | Discharge status: Home / Self Care | Payer: Medicare Other | Source: Ambulatory Visit | Attending: Internal Medicine | Admitting: Internal Medicine

## 2017-08-24 DIAGNOSIS — R0602 Shortness of breath: Secondary | ICD-10-CM | POA: Insufficient documentation

## 2017-08-24 DIAGNOSIS — Z79899 Other long term (current) drug therapy: Secondary | ICD-10-CM | POA: Insufficient documentation

## 2017-08-24 DIAGNOSIS — Z85828 Personal history of other malignant neoplasm of skin: Secondary | ICD-10-CM | POA: Insufficient documentation

## 2017-08-24 DIAGNOSIS — I10 Essential (primary) hypertension: Secondary | ICD-10-CM | POA: Insufficient documentation

## 2017-08-24 DIAGNOSIS — Z882 Allergy status to sulfonamides status: Secondary | ICD-10-CM | POA: Diagnosis not present

## 2017-08-24 DIAGNOSIS — I340 Nonrheumatic mitral (valve) insufficiency: Secondary | ICD-10-CM | POA: Diagnosis not present

## 2017-08-24 DIAGNOSIS — E0500 Thyrotoxicosis with diffuse goiter without thyrotoxic crisis or storm: Secondary | ICD-10-CM | POA: Insufficient documentation

## 2017-08-24 DIAGNOSIS — Z79890 Hormone replacement therapy (postmenopausal): Secondary | ICD-10-CM | POA: Diagnosis not present

## 2017-08-24 DIAGNOSIS — Z885 Allergy status to narcotic agent status: Secondary | ICD-10-CM | POA: Diagnosis not present

## 2017-08-24 DIAGNOSIS — Z7901 Long term (current) use of anticoagulants: Secondary | ICD-10-CM | POA: Insufficient documentation

## 2017-08-24 DIAGNOSIS — I480 Paroxysmal atrial fibrillation: Secondary | ICD-10-CM | POA: Diagnosis present

## 2017-08-24 DIAGNOSIS — Z888 Allergy status to other drugs, medicaments and biological substances status: Secondary | ICD-10-CM | POA: Diagnosis not present

## 2017-08-24 DIAGNOSIS — Z7951 Long term (current) use of inhaled steroids: Secondary | ICD-10-CM | POA: Diagnosis not present

## 2017-08-24 DIAGNOSIS — Z881 Allergy status to other antibiotic agents status: Secondary | ICD-10-CM | POA: Diagnosis not present

## 2017-08-24 HISTORY — PX: TEE WITHOUT CARDIOVERSION: SHX5443

## 2017-08-24 HISTORY — DX: Muscle spasm of back: M62.830

## 2017-08-24 SURGERY — ECHOCARDIOGRAM, TRANSESOPHAGEAL
Anesthesia: Moderate Sedation

## 2017-08-24 MED ORDER — FENTANYL CITRATE (PF) 100 MCG/2ML IJ SOLN
INTRAMUSCULAR | Status: AC | PRN
  Administered 2017-08-24 (×2): 50 ug via INTRAVENOUS
  Administered 2017-08-24: 25 ug via INTRAVENOUS

## 2017-08-24 MED ORDER — MIDAZOLAM HCL 2 MG/2ML IJ SOLN
INTRAMUSCULAR | Status: AC | PRN
  Administered 2017-08-24 (×2): 2 mg via INTRAVENOUS
  Administered 2017-08-24: 1 mg via INTRAVENOUS

## 2017-08-24 MED ORDER — MIDAZOLAM HCL 5 MG/5ML IJ SOLN
INTRAMUSCULAR | Status: AC
  Filled 2017-08-24: qty 5

## 2017-08-24 MED ORDER — FENTANYL CITRATE (PF) 100 MCG/2ML IJ SOLN
INTRAMUSCULAR | Status: AC
  Filled 2017-08-24: qty 2

## 2017-08-24 MED ORDER — SODIUM CHLORIDE 0.9 % IV SOLN
INTRAVENOUS | Status: DC
  Administered 2017-08-24: 07:00:00 via INTRAVENOUS

## 2017-08-24 MED ORDER — LIDOCAINE VISCOUS 2 % MT SOLN
OROMUCOSAL | Status: AC
  Administered 2017-08-24: 15 mL
  Filled 2017-08-24: qty 15

## 2017-08-24 MED ORDER — BUTAMBEN-TETRACAINE-BENZOCAINE 2-2-14 % EX AERO
INHALATION_SPRAY | CUTANEOUS | Status: AC
  Administered 2017-08-24: 08:00:00 via ORAL
  Filled 2017-08-24: qty 5

## 2017-08-24 MED ORDER — SODIUM CHLORIDE FLUSH 0.9 % IV SOLN
INTRAVENOUS | Status: AC
  Filled 2017-08-24: qty 10

## 2017-08-24 NOTE — Progress Notes (Signed)
Patient clinically stable post TEE per Dr Nehemiah Massed, vitals stable. Remains AFib on monitor from pre procedure. Denies complaints. Husband at bedside. Dr Nehemiah Massed out to speak with patient and husband with questions answered.

## 2017-08-24 NOTE — Progress Notes (Signed)
*  PRELIMINARY RESULTS* Echocardiogram Echocardiogram Transesophageal has been performed.  Emily Velazquez 08/24/2017, 8:32 AM

## 2017-08-24 NOTE — Discharge Instructions (Signed)
Transesophageal Echocardiogram °Transesophageal echocardiography (TEE) is a picture test of your heart using sound waves. The pictures taken can give very detailed pictures of your heart. This can help your doctor see if there are problems with your heart. TEE can check: °· If your heart has blood clots in it. °· How well your heart valves are working. °· If you have an infection on the inside of your heart. °· Some of the major arteries of your heart. °· If your heart valve is working after a repair. °· Your heart before a procedure that uses a shock to your heart to get the rhythm back to normal. ° °What happens before the procedure? °· Do not eat or drink for 6 hours before the procedure or as told by your doctor. °· Make plans to have someone drive you home after the procedure. Do not drive yourself home. °· An IV tube will be put in your arm. °What happens during the procedure? °· You will be given a medicine to help you relax (sedative). It will be given through the IV tube. °· A numbing medicine will be sprayed or gargled in the back of your throat to help numb it. °· The tip of the probe is placed into the back of your mouth. You will be asked to swallow. This helps to pass the probe into your esophagus. °· Once the tip of the probe is in the right place, your doctor can take pictures of your heart. °· You may feel pressure at the back of your throat. °What happens after the procedure? °· You will be taken to a recovery area so the sedative can wear off. °· Your throat may be sore and scratchy. This will go away slowly over time. °· You will go home when you are fully awake and able to swallow liquids. °· You should have someone stay with you for the next 24 hours. °· Do not drive or operate machinery for the next 24 hours. °This information is not intended to replace advice given to you by your health care provider. Make sure you discuss any questions you have with your health care provider. °Document  Released: 04/10/2009 Document Revised: 11/19/2015 Document Reviewed: 12/13/2012 °Elsevier Interactive Patient Education © 2018 Elsevier Inc. ° °

## 2017-08-25 ENCOUNTER — Encounter: Payer: Self-pay | Admitting: Internal Medicine

## 2017-09-07 NOTE — CV Procedure (Signed)
The patient underwent transesophageal echocardiogram without complication  TEE will be further reported in imaging area although patient has normal LV systolic function with moderate mitral regurgitation

## 2017-10-25 HISTORY — PX: ATRIAL FIBRILLATION ABLATION: SHX5732

## 2017-12-06 ENCOUNTER — Ambulatory Visit
Admission: RE | Admit: 2017-12-06 | Discharge: 2017-12-06 | Disposition: A | Discharge status: Home / Self Care | Payer: Medicare Other | Source: Ambulatory Visit | Attending: Internal Medicine | Admitting: Internal Medicine

## 2017-12-06 DIAGNOSIS — Z1231 Encounter for screening mammogram for malignant neoplasm of breast: Secondary | ICD-10-CM | POA: Diagnosis not present

## 2017-12-06 DIAGNOSIS — Z1239 Encounter for other screening for malignant neoplasm of breast: Secondary | ICD-10-CM

## 2018-01-17 ENCOUNTER — Inpatient Hospital Stay
Admission: EM | Admit: 2018-01-17 | Discharge: 2018-01-20 | DRG: 872 | Disposition: A | Discharge status: Home / Self Care | Payer: Medicare Other | Attending: Specialist | Admitting: Specialist

## 2018-01-17 ENCOUNTER — Encounter: Payer: Self-pay | Admitting: Emergency Medicine

## 2018-01-17 ENCOUNTER — Emergency Department: Payer: Medicare Other

## 2018-01-17 DIAGNOSIS — R6520 Severe sepsis without septic shock: Secondary | ICD-10-CM | POA: Diagnosis present

## 2018-01-17 DIAGNOSIS — R402250 Coma scale, best verbal response, oriented, unspecified time: Secondary | ICD-10-CM | POA: Diagnosis present

## 2018-01-17 DIAGNOSIS — N179 Acute kidney failure, unspecified: Secondary | ICD-10-CM | POA: Diagnosis present

## 2018-01-17 DIAGNOSIS — R402360 Coma scale, best motor response, obeys commands, unspecified time: Secondary | ICD-10-CM | POA: Diagnosis present

## 2018-01-17 DIAGNOSIS — A4151 Sepsis due to Escherichia coli [E. coli]: Secondary | ICD-10-CM | POA: Diagnosis present

## 2018-01-17 DIAGNOSIS — N136 Pyonephrosis: Secondary | ICD-10-CM | POA: Diagnosis present

## 2018-01-17 DIAGNOSIS — Z96653 Presence of artificial knee joint, bilateral: Secondary | ICD-10-CM | POA: Diagnosis present

## 2018-01-17 DIAGNOSIS — N200 Calculus of kidney: Secondary | ICD-10-CM

## 2018-01-17 DIAGNOSIS — A419 Sepsis, unspecified organism: Secondary | ICD-10-CM

## 2018-01-17 DIAGNOSIS — Z7901 Long term (current) use of anticoagulants: Secondary | ICD-10-CM

## 2018-01-17 DIAGNOSIS — I10 Essential (primary) hypertension: Secondary | ICD-10-CM | POA: Diagnosis present

## 2018-01-17 DIAGNOSIS — Z885 Allergy status to narcotic agent status: Secondary | ICD-10-CM | POA: Diagnosis not present

## 2018-01-17 DIAGNOSIS — Z79899 Other long term (current) drug therapy: Secondary | ICD-10-CM

## 2018-01-17 DIAGNOSIS — R402140 Coma scale, eyes open, spontaneous, unspecified time: Secondary | ICD-10-CM | POA: Diagnosis present

## 2018-01-17 DIAGNOSIS — I480 Paroxysmal atrial fibrillation: Secondary | ICD-10-CM | POA: Diagnosis present

## 2018-01-17 DIAGNOSIS — Z79890 Hormone replacement therapy (postmenopausal): Secondary | ICD-10-CM

## 2018-01-17 DIAGNOSIS — E876 Hypokalemia: Secondary | ICD-10-CM | POA: Diagnosis present

## 2018-01-17 DIAGNOSIS — Z91040 Latex allergy status: Secondary | ICD-10-CM | POA: Diagnosis not present

## 2018-01-17 DIAGNOSIS — Z888 Allergy status to other drugs, medicaments and biological substances status: Secondary | ICD-10-CM | POA: Diagnosis not present

## 2018-01-17 DIAGNOSIS — Z881 Allergy status to other antibiotic agents status: Secondary | ICD-10-CM | POA: Diagnosis not present

## 2018-01-17 DIAGNOSIS — N1330 Unspecified hydronephrosis: Secondary | ICD-10-CM

## 2018-01-17 DIAGNOSIS — N10 Acute tubulo-interstitial nephritis: Secondary | ICD-10-CM | POA: Diagnosis present

## 2018-01-17 DIAGNOSIS — N12 Tubulo-interstitial nephritis, not specified as acute or chronic: Secondary | ICD-10-CM

## 2018-01-17 DIAGNOSIS — Z882 Allergy status to sulfonamides status: Secondary | ICD-10-CM | POA: Diagnosis not present

## 2018-01-17 HISTORY — DX: Paroxysmal atrial fibrillation: I48.0

## 2018-01-17 HISTORY — PX: IR NEPHROSTOMY PLACEMENT RIGHT: IMG6064

## 2018-01-17 LAB — URINALYSIS, COMPLETE (UACMP) WITH MICROSCOPIC
Bilirubin Urine: NEGATIVE
Glucose, UA: NEGATIVE mg/dL
Ketones, ur: NEGATIVE mg/dL
NITRITE: NEGATIVE
PH: 5 (ref 5.0–8.0)
Protein, ur: 100 mg/dL — AB
SPECIFIC GRAVITY, URINE: 1.018 (ref 1.005–1.030)
WBC, UA: >50 WBC/hpf — ABNORMAL HIGH (ref 0–5)

## 2018-01-17 LAB — CBC
HCT: 38.6 % (ref 35.0–47.0)
Hemoglobin: 13.5 g/dL (ref 12.0–16.0)
MCH: 32 pg (ref 26.0–34.0)
MCHC: 34.9 g/dL (ref 32.0–36.0)
MCV: 91.7 fL (ref 80.0–100.0)
PLATELETS: 164 10*3/uL (ref 150–440)
RBC: 4.21 MIL/uL (ref 3.80–5.20)
RDW: 15 % — AB (ref 11.5–14.5)
WBC: 12.4 10*3/uL — ABNORMAL HIGH (ref 3.6–11.0)

## 2018-01-17 LAB — LACTIC ACID, PLASMA: Lactic Acid, Venous: 1.5 mmol/L (ref 0.5–1.9)

## 2018-01-17 LAB — COMPREHENSIVE METABOLIC PANEL
ALT: 16 U/L (ref 0–44)
AST: 27 U/L (ref 15–41)
Albumin: 3.2 g/dL — ABNORMAL LOW (ref 3.5–5.0)
Alkaline Phosphatase: 68 U/L (ref 38–126)
Anion gap: 11 (ref 5–15)
BILIRUBIN TOTAL: 4.4 mg/dL — AB (ref 0.3–1.2)
BUN: 29 mg/dL — AB (ref 8–23)
CALCIUM: 8.3 mg/dL — AB (ref 8.9–10.3)
CO2: 25 mmol/L (ref 22–32)
CREATININE: 1.91 mg/dL — AB (ref 0.44–1.00)
Chloride: 97 mmol/L — ABNORMAL LOW (ref 98–111)
GFR, EST AFRICAN AMERICAN: 30 mL/min — AB (ref >60)
GFR, EST NON AFRICAN AMERICAN: 26 mL/min — AB (ref >60)
Glucose, Bld: 133 mg/dL — ABNORMAL HIGH (ref 70–99)
Potassium: 2.9 mmol/L — ABNORMAL LOW (ref 3.5–5.1)
Sodium: 133 mmol/L — ABNORMAL LOW (ref 135–145)
TOTAL PROTEIN: 7.6 g/dL (ref 6.5–8.1)

## 2018-01-17 LAB — LIPASE, BLOOD: Lipase: 22 U/L (ref 11–51)

## 2018-01-17 LAB — PROCALCITONIN: PROCALCITONIN: 4.09 ng/mL

## 2018-01-17 MED ORDER — ONDANSETRON 4 MG PO TBDP
4.0000 mg | ORAL_TABLET | Freq: Once | ORAL | Status: AC | PRN
  Administered 2018-01-17: 4 mg via ORAL
  Filled 2018-01-17: qty 1

## 2018-01-17 MED ORDER — IOPAMIDOL (ISOVUE-300) INJECTION 61%
30.0000 mL | Freq: Once | INTRAVENOUS | Status: AC | PRN
  Administered 2018-01-17: 10 mL

## 2018-01-17 MED ORDER — SODIUM CHLORIDE 0.9 % IV BOLUS
1000.0000 mL | Freq: Once | INTRAVENOUS | Status: AC
  Administered 2018-01-17: 1000 mL via INTRAVENOUS

## 2018-01-17 MED ORDER — SODIUM CHLORIDE 0.9 % IV SOLN
1.0000 g | Freq: Once | INTRAVENOUS | Status: AC
  Administered 2018-01-17: 1 g via INTRAVENOUS
  Filled 2018-01-17: qty 10

## 2018-01-17 MED ORDER — FENTANYL CITRATE (PF) 100 MCG/2ML IJ SOLN
100.0000 ug | Freq: Once | INTRAMUSCULAR | Status: AC
  Administered 2018-01-17: 100 ug via INTRAVENOUS
  Filled 2018-01-17: qty 2

## 2018-01-17 MED ORDER — FENTANYL CITRATE (PF) 100 MCG/2ML IJ SOLN
INTRAMUSCULAR | Status: AC
  Filled 2018-01-17: qty 2

## 2018-01-17 MED ORDER — HALOPERIDOL LACTATE 5 MG/ML IJ SOLN
2.5000 mg | Freq: Once | INTRAMUSCULAR | Status: AC
  Administered 2018-01-17: 2.5 mg via INTRAVENOUS
  Filled 2018-01-17: qty 1

## 2018-01-17 MED ORDER — SODIUM CHLORIDE 0.9 % IV SOLN
1.0000 g | Freq: Two times a day (BID) | INTRAVENOUS | Status: DC
  Administered 2018-01-18 – 2018-01-20 (×5): 1 g via INTRAVENOUS
  Filled 2018-01-17 (×7): qty 1

## 2018-01-17 MED ORDER — FENTANYL CITRATE (PF) 100 MCG/2ML IJ SOLN
INTRAMUSCULAR | Status: AC | PRN
  Administered 2018-01-17: 50 ug via INTRAVENOUS

## 2018-01-17 MED ORDER — MIDAZOLAM HCL 2 MG/2ML IJ SOLN
INTRAMUSCULAR | Status: AC
  Filled 2018-01-17: qty 2

## 2018-01-17 MED ORDER — LIDOCAINE HCL (PF) 1 % IJ SOLN
INTRAMUSCULAR | Status: AC
Start: 2018-01-17 — End: ?
  Filled 2018-01-17: qty 30

## 2018-01-17 MED ORDER — SODIUM CHLORIDE 0.9 % IV BOLUS: 1000.0000 mL | Freq: Once | INTRAVENOUS | Status: DC

## 2018-01-17 MED ORDER — MIDAZOLAM HCL 2 MG/2ML IJ SOLN
INTRAMUSCULAR | Status: AC | PRN
  Administered 2018-01-17: 1 mg via INTRAVENOUS

## 2018-01-17 NOTE — ED Notes (Signed)
Dr. Jarvis Newcomer at bedside speaking with pt/husband. MD Hassel aware that only 1 blood culture obtained and only 1 IV in place.

## 2018-01-17 NOTE — H&P (Signed)
[facility] NAME: Emily Velazquez    MR#:  [id number]  DATE OF BIRTH:  1951/04/18  DATE OF ADMISSION:  01/17/2018  PRIMARY CARE PHYSICIAN: Tracie Harrier, MD   REQUESTING/REFERRING PHYSICIAN: Mable Paris, MD  CHIEF COMPLAINT:   Chief Complaint  Patient presents with  . Abdominal Pain    HISTORY OF PRESENT ILLNESS:  Emily Velazquez  is a 67 y.o. female who presents with 4 days of flank pain.  Patient has a history of kidney stones and has required lithotripsy multiple times in the past.  Here in the ED tonight she is found to meet sepsis criteria, and has a large kidney stone.  Patient meets sepsis criteria.  Urology was contacted by ED physician and patient was taken to IR for nephrostomy drain.  Hospitalist were called for admission  PAST MEDICAL HISTORY:   Past Medical History:  Diagnosis Date  . Back spasm    VOCAL CORD  . Edema   . Gout   . Heart murmur   . History of kidney stones   . Hypertension   . IBS (irritable bowel syndrome)   . OAB (overactive bladder)   . PAF (paroxysmal atrial fibrillation) ([place])   . Rosacea      PAST SURGICAL HISTORY:   Past Surgical History:  Procedure Laterality Date  . CARDIOVERSION N/A 05/23/2017   Procedure: CARDIOVERSION;  Surgeon: [name], MD;  Location: [facility] ORS;  Service: Cardiovascular;  Laterality: N/A;  . CARDIOVERSION N/A 06/22/2017   Procedure: CARDIOVERSION;  Surgeon: [name], MD;  Location: [facility] ORS;  Service: Cardiovascular;  Laterality: N/A;  . CATARACT EXTRACTION W/PHACO Right 10/26/2016   Procedure: CATARACT EXTRACTION PHACO AND INTRAOCULAR LENS PLACEMENT ([place]) suture placed in right eye at end of procedure;  Surgeon: [name], MD;  Location: [facility] ORS;  Service: Ophthalmology;  Laterality: Right;  [place]  01:21 AP% 23.7 CDE 36.24 Fluid pack lot # [id number]  . COLON SURGERY     RESECTION  . JOINT REPLACEMENT     BIL TKR  . REPLACEMENT  TOTAL KNEE BILATERAL    . TEE WITHOUT CARDIOVERSION N/A 08/24/2017   Procedure: TRANSESOPHAGEAL ECHOCARDIOGRAM (TEE);  Surgeon: [name], MD;  Location: [facility] ORS;  Service: Cardiovascular;  Laterality: N/A;  . VAGINAL HYSTERECTOMY  1997   complete     SOCIAL HISTORY:   Social History   Tobacco Use  . Smoking status: Never Smoker  . Smokeless tobacco: Never Used  Substance Use Topics  . Alcohol use: No     FAMILY HISTORY:   Family History  Problem Relation Age of Onset  . Breast cancer Daughter 16     DRUG ALLERGIES:   Allergies  Allergen Reactions  . Ace Inhibitors Itching  . Codeine Sulfate Itching        . Latex Itching and Other (See Comments)    Gloves   . Levofloxacin Other (See Comments)    Gastritis.  Onset 08/03/1999.  Marland Kitchen Shellfish-Derived Products Other (See Comments)    Unknown, showed up on allergy test  . Sulfa Antibiotics Itching    MEDICATIONS AT HOME:   Prior to Admission medications   Medication Sig Start Date End Date Taking? Authorizing Provider  acetaminophen (TYLENOL) 500 MG tablet Take 1,000 mg by mouth 2 (two) times daily as needed for mild pain.   Yes [provider]  amiodarone (PACERONE) 200 MG tablet Take 200 mg by mouth daily. 10/26/17  Yes [provider]  amLODipine (NORVASC) 5 MG tablet Take 5 mg daily by mouth.   Yes [provider]  apixaban (ELIQUIS) 5 MG TABS tablet Take 5 mg 2 (two) times daily by mouth.   Yes [provider]  estradiol (ESTRACE) 0.5 MG tablet Take 0.5 mg by mouth daily. 09/04/16  Yes [provider]  metoprolol tartrate (LOPRESSOR) 100 MG tablet Take 1 tablet (100 mg total) by mouth 2 (two) times daily. One half tablet twice per day 05/23/17  Yes [name], MD    REVIEW OF SYSTEMS:  Review of Systems  Constitutional: Positive for chills, fever and malaise/fatigue. Negative for weight loss.  HENT: Negative for ear pain, hearing loss and tinnitus.    Eyes: Negative for blurred vision, double vision, pain and redness.  Respiratory: Negative for cough, hemoptysis and shortness of breath.   Cardiovascular: Negative for chest pain, palpitations, orthopnea and leg swelling.  Gastrointestinal: Negative for abdominal pain, constipation, diarrhea, nausea and vomiting.  Genitourinary: Positive for dysuria and flank pain. Negative for frequency and hematuria.  Musculoskeletal: Negative for back pain, joint pain and neck pain.  Skin:       No acne, rash, or lesions  Neurological: Negative for dizziness, tremors, focal weakness and weakness.  Endo/Heme/Allergies: Negative for polydipsia. Does not bruise/bleed easily.  Psychiatric/Behavioral: Negative for depression. The patient is not nervous/anxious and does not have insomnia.      VITAL SIGNS:   Vitals:   01/17/18 2130 01/17/18 2200 01/17/18 2230 01/17/18 2300  BP: (!) 141/63 (!) 140/59 137/63 136/62  Pulse: (!) 105 96 98 97  Resp: 18 (!) 23 (!) 21 19  Temp: 99 F (37.2 C)     TempSrc: Oral     SpO2: (P) 90% 92% 95% 95%  Weight:      Height:       Wt Readings from Last 3 Encounters:  01/17/18 95.3 kg (210 lb)  08/24/17 94.3 kg (208 lb)  06/22/17 94.3 kg (208 lb)    PHYSICAL EXAMINATION:  Physical Exam  Vitals reviewed. Constitutional: She is oriented to person, place, and time. She appears well-developed and well-nourished. No distress.  HENT:  Head: Normocephalic and atraumatic.  Mouth/Throat: Oropharynx is clear and moist.  Eyes: Pupils are equal, round, and reactive to light. Conjunctivae and EOM are normal. No scleral icterus.  Neck: Normal range of motion. Neck supple. No JVD present. No thyromegaly present.  Cardiovascular: Regular rhythm and intact distal pulses. Exam reveals no gallop and no friction rub.  No murmur heard. Tachycardic  Respiratory: Effort normal and breath sounds normal. No respiratory distress. She has no wheezes. She has no rales.  GI: Soft. Bowel  sounds are normal. She exhibits no distension. There is no tenderness.  Musculoskeletal: Normal range of motion. She exhibits tenderness (Flank tenderness). She exhibits no edema.  No arthritis, no gout  Lymphadenopathy:    She has no cervical adenopathy.  Neurological: She is alert and oriented to person, place, and time. No cranial nerve deficit.  No dysarthria, no aphasia  Skin: Skin is warm and dry. No rash noted. No erythema.  Psychiatric: She has a normal mood and affect. Her behavior is normal. Judgment and thought content normal.    LABORATORY PANEL:   CBC Recent Labs  Lab 01/17/18 1729  WBC 12.4*  HGB 13.5  HCT 38.6  PLT 164   ------------------------------------------------------------------------------------------------------------------  Chemistries  Recent Labs  Lab 01/17/18 1729  NA 133*  K 2.9*  CL 97*  CO2 25  GLUCOSE 133*  BUN 29*  CREATININE 1.91*  CALCIUM 8.3*  AST 27  ALT 16  ALKPHOS 68  BILITOT 4.4*   ------------------------------------------------------------------------------------------------------------------  Cardiac Enzymes No results for input(s): TROPONINI in the last 168 hours. ------------------------------------------------------------------------------------------------------------------  RADIOLOGY:  Ct Abdomen Pelvis Wo Contrast  Result Date: 01/17/2018 CLINICAL DATA:  Abdominal pain with nausea and vomiting and diarrhea EXAM: CT ABDOMEN AND PELVIS WITHOUT CONTRAST TECHNIQUE: Multidetector CT imaging of the abdomen and pelvis was performed following the standard protocol without IV contrast. COMPARISON:  Radiograph 07/02/2013, CT 10/27/2010 FINDINGS: Lower chest: Lung bases demonstrate atelectasis at the left base. No pleural effusion. Borderline cardiomegaly. Small distal esophageal hiatal hernia. Hepatobiliary: No focal liver abnormality is seen. No gallstones, gallbladder wall thickening, or biliary dilatation. Pancreas:  Unremarkable. No pancreatic ductal dilatation or surrounding inflammatory changes. Spleen: Normal in size without focal abnormality. Adrenals/Urinary Tract: Adrenal glands are within normal limits. Enlarged right kidney with moderate to marked right perinephric fat stranding. Multiple calcified stones within the right kidney including a 2.3 cm stone in the right renal pelvis, causing mild right hydronephrosis. Small foci of air in the bladder. Stomach/Bowel: Stomach nonenlarged. Postsurgical changes of the transverse colon. No bowel wall thickening. Vascular/Lymphatic: Nonaneurysmal aorta. Small retroperitoneal lymph nodes measuring up to 8 mm. Reproductive: Status post hysterectomy. No adnexal masses. Other: Negative for free air. Trace fluid in the pelvis. Edema and soft tissue stranding in the right retroperitoneum. Musculoskeletal: No acute or suspicious abnormality. Probable bone island at T11. IMPRESSION: 1. Moderate right perinephric fat stranding with mild right hydronephrosis. This appears to be secondary to a 2.4 cm stone within the right renal pelvis, encroaching the right UPJ. There are multiple additional stones present within the right kidney. 2. Small foci of air within the bladder, this may be due to recent manipulation. Electronically Signed   By: Donavan Foil M.D.   On: 01/17/2018 19:00    EKG:   Orders placed or performed during the hospital encounter of 01/17/18  . ED EKG  . ED EKG  . EKG 12-Lead  . EKG 12-Lead  . ED EKG 12-Lead  . ED EKG 12-Lead    IMPRESSION AND PLAN:  Principal Problem:   Severe sepsis ([place]) -due to pyelonephritis from UTI with hydronephrosis and kidney stone.  She had nephrostomy drain placed tonight.  Urology consult in place.  IV antibiotics started, lactic acid is within normal limits and the patient is hemodynamically stable.  Cultures sent. Active Problems:   Right kidney stone -urology consult as above   Acute pyelonephritis -IV antibiotics, culture  sent, nephrostomy drain placed   AKI (acute kidney injury) ([place]) -due to pyelonephritis, kidney stone, avoid nephrotoxins and monitor   HTN (hypertension) -continue home meds   PAF (paroxysmal atrial fibrillation) ([place]) -continue home meds except for Eliquis.  We will hold this for now until it is clear what surgical route patient may take.  She has not taken it for the last 3 days anyway due to nausea and poor p.o. intake.  Chart review performed and case discussed with ED provider. Labs, imaging and/or ECG reviewed by provider and discussed with patient/family. Management plans discussed with the patient and/or family.  DVT PROPHYLAXIS: Systemic anticoagulation  GI PROPHYLAXIS: None  ADMISSION STATUS: Inpatient  CODE STATUS: Full  TOTAL TIME TAKING CARE OF THIS PATIENT: 45 minutes.   Eshawn Coor [place] [employer] 01/17/2018, 11:19 PM  [employer]  [phone]  CC: Primary care  physician; Tracie Harrier, MD  Note:  This document was prepared using Dragon voice recognition software and may include unintentional dictation errors.

## 2018-01-17 NOTE — ED Notes (Addendum)
Pt resting at bedside, appears comfortable. Denies any needs at this time. Husband at bedside. Nephrostomy tube on R side. Red drainage noted at this time.

## 2018-01-17 NOTE — ED Notes (Signed)
Pt returned from IR. Report received from [facility]

## 2018-01-17 NOTE — ED Provider Notes (Addendum)
[facility] Emergency Department Provider Note  ____________________________________________   First MD Initiated Contact with Patient 01/17/18 1800     (approximate)  I have reviewed the triage vital signs and the nursing notes.   HISTORY  Chief Complaint Abdominal Pain    HPI [name] is a 67 y.o. female who self presents to the emergency department with abdominal pain nausea vomiting and diarrhea for the past 3 days.  The patient's symptoms began gradually and have been slowly progressive are now constant.  Her pain is moderate to severe cramping.  Nothing particular seems to make it better or worse.  She did have a remote hysterectomy that was complicated by a bowel injury requiring bowel resection in the 90s.  The patient reports some dysuria.  She denies fevers but does report chills.  Her pain is nonradiating.   Past Medical History:  Diagnosis Date  . Back spasm    VOCAL CORD  . Edema   . Gout   . Heart murmur   . History of kidney stones   . Hypertension   . IBS (irritable bowel syndrome)   . OAB (overactive bladder)   . Rosacea     There are no active problems to display for this patient.   Past Surgical History:  Procedure Laterality Date  . CARDIOVERSION N/A 05/23/2017   Procedure: CARDIOVERSION;  Surgeon: [name], MD;  Location: [facility] ORS;  Service: Cardiovascular;  Laterality: N/A;  . CARDIOVERSION N/A 06/22/2017   Procedure: CARDIOVERSION;  Surgeon: [name], MD;  Location: [facility] ORS;  Service: Cardiovascular;  Laterality: N/A;  . CATARACT EXTRACTION W/PHACO Right 10/26/2016   Procedure: CATARACT EXTRACTION PHACO AND INTRAOCULAR LENS PLACEMENT ([employer]) suture placed in right eye at end of procedure;  Surgeon: [name], MD;  Location: [facility] ORS;  Service: Ophthalmology;  Laterality: Right;  [place]  01:21 AP% 23.7 CDE 36.24 Fluid pack lot # [id number]  . COLON SURGERY     RESECTION  . JOINT REPLACEMENT     BIL TKR  . REPLACEMENT TOTAL KNEE BILATERAL    . TEE WITHOUT CARDIOVERSION N/A 08/24/2017   Procedure: TRANSESOPHAGEAL ECHOCARDIOGRAM (TEE);  Surgeon: [name], MD;  Location: [facility] ORS;  Service: Cardiovascular;  Laterality: N/A;  . Brush   complete    Prior to Admission medications   Medication Sig Start Date End Date Taking? Authorizing Provider  acetaminophen (TYLENOL) 500 MG tablet Take 1,000 mg by mouth 2 (two) times daily as needed for mild pain.    [provider]  amLODipine (NORVASC) 5 MG tablet Take 5 mg daily by mouth.    [provider]  apixaban (ELIQUIS) 5 MG TABS tablet Take 5 mg 2 (two) times daily by mouth.    [provider]  estradiol (ESTRACE) 0.5 MG tablet Take 0.5 mg by mouth daily. 09/04/16   [provider]  metoprolol tartrate (LOPRESSOR) 100 MG tablet Take 1 tablet (100 mg total) by mouth 2 (two) times daily. One half tablet twice per day Patient taking differently: Take 150 mg by mouth 2 (two) times daily. One half tablet twice per day 05/23/17   [name], MD  oxyCODONE-acetaminophen (PERCOCET/ROXICET) 5-325 MG tablet Take 1 tablet by mouth every 4 (four) hours as needed for severe pain (for kidney stone pain if needed.).     [provider]    Allergies Ace inhibitors; Codeine sulfate; Latex; Levofloxacin; Shellfish-derived products; and Sulfa antibiotics  Family  History  Problem Relation Age of Onset  . Breast cancer Daughter 72    Social History Social History   Tobacco Use  . Smoking status: Never Smoker  . Smokeless tobacco: Never Used  Substance Use Topics  . Alcohol use: No  . Drug use: No    Review of Systems Constitutional: No fever/chills Eyes: No visual changes. ENT: No sore throat. Cardiovascular: Denies chest pain. Respiratory: Denies shortness of breath. Gastrointestinal: Positive for abdominal pain.  Positive for nausea, positive for vomiting.   Positive for diarrhea.  No constipation. Genitourinary: Positive for dysuria. Musculoskeletal: Negative for back pain. Skin: Negative for rash. Neurological: Negative for headaches, focal weakness or numbness.   ____________________________________________   PHYSICAL EXAM:  VITAL SIGNS: ED Triage Vitals  Enc Vitals Group     BP 01/17/18 1720 (!) 148/71     Pulse Rate 01/17/18 1720 (!) 133     Resp 01/17/18 1720 20     Temp 01/17/18 1720 98.5 F (36.9 C)     Temp Source 01/17/18 1720 Oral     SpO2 01/17/18 1720 97 %     Weight 01/17/18 1721 210 lb (95.3 kg)     Height 01/17/18 1721 5\' 3"  (1.6 m)     Head Circumference --      Peak Flow --      Pain Score 01/17/18 1720 8     Pain Loc --      Pain Edu? --      Excl. in [place]? --     Constitutional: Alert and oriented x4 appears uncomfortable holding her abdomen tearful mild diaphoresis Eyes: PERRL EOMI. Head: Atraumatic. Nose: No congestion/rhinnorhea. Mouth/Throat: No trismus Neck: No stridor.   Cardiovascular: Tachycardic rate, regular rhythm. Grossly normal heart sounds.  Good peripheral circulation. Respiratory: Increased respiratory effort.  No retractions. Lungs CTAB and moving good air Gastrointestinal: Soft diffusely tender abdomen with no rebound tenderness although no frank peritonitis.  No costovertebral tenderness Musculoskeletal: No lower extremity edema   Neurologic:  Normal speech and language. No gross focal neurologic deficits are appreciated. Skin: Mildly diaphoretic Psychiatric: Mood and affect are normal. Speech and behavior are normal.    ____________________________________________   DIFFERENTIAL includes but not limited to  Small bowel obstruction, volvulus, urinary tract infection, pyelonephritis, nephrolithiasis ____________________________________________   LABS (all labs ordered are listed, but only abnormal results are displayed)  Labs Reviewed  COMPREHENSIVE METABOLIC PANEL - Abnormal;  Notable for the following components:      Result Value   Sodium 133 (*)    Potassium 2.9 (*)    Chloride 97 (*)    Glucose, Bld 133 (*)    BUN 29 (*)    Creatinine, Ser 1.91 (*)    Calcium 8.3 (*)    Albumin 3.2 (*)    Total Bilirubin 4.4 (*)    GFR calc non Af Amer 26 (*)    GFR calc Af Amer 30 (*)    All other components within normal limits  CBC - Abnormal; Notable for the following components:   WBC 12.4 (*)    RDW 15.0 (*)    All other components within normal limits  URINALYSIS, COMPLETE (UACMP) WITH MICROSCOPIC - Abnormal; Notable for the following components:   Color, Urine AMBER (*)    APPearance CLOUDY (*)    Hgb urine dipstick MODERATE (*)    Protein, ur 100 (*)    Leukocytes, UA MODERATE (*)    WBC, UA >50 (*)  Bacteria, UA RARE (*)    All other components within normal limits  URINE CULTURE  CULTURE, BLOOD (ROUTINE X 2)  CULTURE, BLOOD (ROUTINE X 2)  LIPASE, BLOOD  LACTIC ACID, PLASMA  LACTIC ACID, PLASMA  PROCALCITONIN    Work reviewed by me shows acute kidney injury along with elevated white count concerning for infection.  Urinalysis is consistent with infection __________________________________________  EKG  ED ECG REPORT I, Darel Hong, the attending physician, personally viewed and interpreted this ECG.  Date: 01/17/2018 EKG Time:  Rate: 129 Rhythm: normal sinus rhythm QRS Axis: normal Intervals: normal ST/T Wave abnormalities: normal Narrative Interpretation: no evidence of acute ischemia  ____________________________________________  RADIOLOGY  CT abdomen pelvis without contrast reviewed by me with large 2.3 cm right sided kidney stone along with gas in the bladder concerning for infection ____________________________________________   PROCEDURES  Procedure(s) performed: no  .Critical Care Performed by: Darel Hong, MD Authorized by: Darel Hong, MD   Critical care provider statement:    Critical care time  (minutes):  45   Critical care time was exclusive of:  Separately billable procedures and treating other patients   Critical care was necessary to treat or prevent imminent or life-threatening deterioration of the following conditions:  Sepsis   Critical care was time spent personally by me on the following activities:  Development of treatment plan with patient or surrogate, discussions with consultants, evaluation of patient's response to treatment, examination of patient, obtaining history from patient or surrogate, ordering and performing treatments and interventions, ordering and review of laboratory studies, ordering and review of radiographic studies, pulse oximetry, re-evaluation of patient's condition and review of old charts    Critical Care performed: Yes  ____________________________________________   INITIAL IMPRESSION / ASSESSMENT AND PLAN / ED COURSE  Pertinent labs & imaging results that were available during my care of the patient were reviewed by me and considered in my medical decision making (see chart for details).   As part of my medical decision making, I reviewed the following data within the [facility] History obtained from family if available, nursing notes, old chart and ekg, as well as notes from prior ED visits.  The patient arrives quite uncomfortable appearing and is tachycardic.  She is diffusely tender and requires a CT scan of her abdomen and pelvis.  IV fentanyl and haloperidol for pain and nausea now.  Unfortunately her renal function is such that we cannot use IV contrast.    The patient's CT scan is back showing a 2.3 cm right sided kidney stone along with some gas in her bladder.  Had recent instrumentation and on reevaluation her skin is quite warm despite her normal oral temperature.  I personally performed a rectal temperature chaperoned by female nurse Anderson Malta and she is 100.9 degrees raising concern for pyelonephritis with an  obstructive stone.  I have a call to urology now.  I am adding on IV fluids, blood cultures, and ceftriaxone.  ----------------------------------------- 7:19 PM on 01/17/2018 ----------------------------------------- I spoke with Dr. Erlene Quan who recommends emergent IR consultation for perc nephrostomy tube.  She'll reach out to them now.   Interventional radiology is available and will take the patient emergently to the operating room for percutaneous nephrostomy tube now.  ____________________________________________   FINAL CLINICAL IMPRESSION(S) / ED DIAGNOSES  Final diagnoses:  Kidney stone  Pyelonephritis  Sepsis, due to unspecified organism [facility])      NEW MEDICATIONS STARTED DURING THIS VISIT:  New Prescriptions  No medications on file     Note:  This document was prepared using Dragon voice recognition software and may include unintentional dictation errors.     Darel Hong, MD 01/17/18 2127    Darel Hong, MD 01/17/18 2138

## 2018-01-17 NOTE — Consult Note (Addendum)
Chief Complaint: Patient was seen in consultation today for  Chief Complaint  Patient presents with  . Abdominal Pain   at the request of Hollice Espy   Referring Physician(s): Erlene Quan  Supervising Physician: Arne Cleveland  Patient Status: [facility] - ED  History of Present Illness: Emily Velazquez is a 67 y.o. female  H/o nephrolithiasis, presents to ED with abd pain, N&V. H/o a fib post ablation 3 mo ago. CT shows large stone centrally in R renal collecting system, mild hydronephrosis, adjacent inflammatory change.   Past Medical History:  Diagnosis Date  . Back spasm    VOCAL CORD  . Edema   . Gout   . Heart murmur   . History of kidney stones   . Hypertension   . IBS (irritable bowel syndrome)   . OAB (overactive bladder)   . Rosacea     Past Surgical History:  Procedure Laterality Date  . CARDIOVERSION N/A 05/23/2017   Procedure: CARDIOVERSION;  Surgeon: [name], MD;  Location: [facility] ORS;  Service: Cardiovascular;  Laterality: N/A;  . CARDIOVERSION N/A 06/22/2017   Procedure: CARDIOVERSION;  Surgeon: [name], MD;  Location: [facility] ORS;  Service: Cardiovascular;  Laterality: N/A;  . CATARACT EXTRACTION W/PHACO Right 10/26/2016   Procedure: CATARACT EXTRACTION PHACO AND INTRAOCULAR LENS PLACEMENT ([place]) suture placed in right eye at end of procedure;  Surgeon: [name], MD;  Location: [facility] ORS;  Service: Ophthalmology;  Laterality: Right;  [place]  01:21 AP% 23.7 CDE 36.24 Fluid pack lot # [id number]  . COLON SURGERY     RESECTION  . JOINT REPLACEMENT     BIL TKR  . REPLACEMENT TOTAL KNEE BILATERAL    . TEE WITHOUT CARDIOVERSION N/A 08/24/2017   Procedure: TRANSESOPHAGEAL ECHOCARDIOGRAM (TEE);  Surgeon: [name], MD;  Location: [facility] ORS;  Service: Cardiovascular;  Laterality: N/A;  . VAGINAL HYSTERECTOMY  1997   complete    Allergies: Ace inhibitors; Codeine sulfate; Latex; Levofloxacin; Shellfish-derived products; and Sulfa  antibiotics  Medications: Prior to Admission medications   Medication Sig Start Date End Date Taking? Authorizing Provider  acetaminophen (TYLENOL) 500 MG tablet Take 1,000 mg by mouth 2 (two) times daily as needed for mild pain.   Yes [provider]  amiodarone (PACERONE) 200 MG tablet Take 200 mg by mouth daily. 10/26/17  Yes [provider]  amLODipine (NORVASC) 5 MG tablet Take 5 mg daily by mouth.   Yes [provider]  apixaban (ELIQUIS) 5 MG TABS tablet Take 5 mg 2 (two) times daily by mouth.   Yes [provider]  estradiol (ESTRACE) 0.5 MG tablet Take 0.5 mg by mouth daily. 09/04/16  Yes [provider]  metoprolol tartrate (LOPRESSOR) 100 MG tablet Take 1 tablet (100 mg total) by mouth 2 (two) times daily. One half tablet twice per day 05/23/17  Yes [name], MD     Family History  Problem Relation Age of Onset  . Breast cancer Daughter 41    Social History   Socioeconomic History  . Marital status: Married    Spouse name: Not on file  . Number of children: Not on file  . Years of education: Not on file  . Highest education level: Not on file  Occupational History  . Not on file  Social Needs  . Financial resource strain: Not on file  . Food insecurity:    Worry: Not on file    Inability: Not on  file  . Transportation needs:    Medical: Not on file    Non-medical: Not on file  Tobacco Use  . Smoking status: Never Smoker  . Smokeless tobacco: Never Used  Substance and Sexual Activity  . Alcohol use: No  . Drug use: No  . Sexual activity: Not on file  Lifestyle  . Physical activity:    Days per week: Not on file    Minutes per session: Not on file  . Stress: Not on file  Relationships  . Social connections:    Talks on phone: Not on file    Gets together: Not on file    Attends religious service: Not on file    Active member of club or organization: Not on file    Attends meetings of clubs or  organizations: Not on file    Relationship status: Not on file  Other Topics Concern  . Not on file  Social History Narrative  . Not on file    ECOG Status: 2 - Symptomatic, <50% confined to bed  Review of Systems: A 12 point ROS discussed and pertinent positives are indicated in the HPI above.  All other systems are negative.  Review of Systems  Vital Signs: BP 136/64   Pulse (!) 107   Temp (!) 100.9 F (38.3 C) (Rectal)   Resp (!) 22   Ht 5\' 3"  (1.6 m)   Wt 210 lb (95.3 kg)   SpO2 97%   BMI 37.20 kg/m   Physical Exam. Constitutional: Oriented to person, place, and time. Well-developed and well-nourished. No distress.  HENT:  Head: Normocephalic and atraumatic.  Eyes: Conjunctivae and EOM are normal. Right eye exhibits no discharge. Left eye exhibits no discharge. No scleral icterus.  Neck: No JVD present.  Pulmonary/Chest: Effort normal. No stridor. No respiratory distress.  Abdomen: soft, non distended Neurological:  alert and oriented to person, place, and time.  Skin: Skin is warm and dry.  not diaphoretic.  Psychiatric:   normal mood and affect.   behavior is normal. Judgment and thought content normal.      Imaging: Ct Abdomen Pelvis Wo Contrast  Result Date: 01/17/2018 CLINICAL DATA:  Abdominal pain with nausea and vomiting and diarrhea EXAM: CT ABDOMEN AND PELVIS WITHOUT CONTRAST TECHNIQUE: Multidetector CT imaging of the abdomen and pelvis was performed following the standard protocol without IV contrast. COMPARISON:  Radiograph 07/02/2013, CT 10/27/2010 FINDINGS: Lower chest: Lung bases demonstrate atelectasis at the left base. No pleural effusion. Borderline cardiomegaly. Small distal esophageal hiatal hernia. Hepatobiliary: No focal liver abnormality is seen. No gallstones, gallbladder wall thickening, or biliary dilatation. Pancreas: Unremarkable. No pancreatic ductal dilatation or surrounding inflammatory changes. Spleen: Normal in size without focal  abnormality. Adrenals/Urinary Tract: Adrenal glands are within normal limits. Enlarged right kidney with moderate to marked right perinephric fat stranding. Multiple calcified stones within the right kidney including a 2.3 cm stone in the right renal pelvis, causing mild right hydronephrosis. Small foci of air in the bladder. Stomach/Bowel: Stomach nonenlarged. Postsurgical changes of the transverse colon. No bowel wall thickening. Vascular/Lymphatic: Nonaneurysmal aorta. Small retroperitoneal lymph nodes measuring up to 8 mm. Reproductive: Status post hysterectomy. No adnexal masses. Other: Negative for free air. Trace fluid in the pelvis. Edema and soft tissue stranding in the right retroperitoneum. Musculoskeletal: No acute or suspicious abnormality. Probable bone island at T11. IMPRESSION: 1. Moderate right perinephric fat stranding with mild right hydronephrosis. This appears to be secondary to a 2.4 cm stone within the right  renal pelvis, encroaching the right UPJ. There are multiple additional stones present within the right kidney. 2. Small foci of air within the bladder, this may be due to recent manipulation. Electronically Signed   By: Donavan Foil M.D.   On: 01/17/2018 19:00    Labs:  CBC: Recent Labs    01/17/18 1729  WBC 12.4*  HGB 13.5  HCT 38.6  PLT 164    COAGS: No results for input(s): INR, APTT in the last 8760 hours.  BMP: Recent Labs    01/17/18 1729  NA 133*  K 2.9*  CL 97*  CO2 25  GLUCOSE 133*  BUN 29*  CALCIUM 8.3*  CREATININE 1.91*  GFRNONAA 26*  GFRAA 30*    LIVER FUNCTION TESTS: Recent Labs    01/17/18 1729  BILITOT 4.4*  AST 27  ALT 16  ALKPHOS 68  PROT 7.6  ALBUMIN 3.2*    TUMOR MARKERS: No results for input(s): AFPTM, CEA, CA199, CHROMGRNA in the last 8760 hours.  Assessment and Plan:  Obstructing R kidney stone with possible pyonephrosis. Perc neph would allow decompression as well as access for future stone removal. Discussed with  Dr Erlene Quan. Will proceed with urgent perc neph tonight. DIscussed with pt and spouse who understand and consent.   Thank you for this interesting consult.  I greatly enjoyed meeting SRIJA SOUTHARD and look forward to participating in their care.  A copy of this report was sent to the requesting provider on this date.  Electronically Signed: Rickard Rhymes, MD 01/17/2018, 8:48 PM   I spent a total of  15 Minutes   in face to face in clinical consultation, greater than 50% of which was counseling/coordinating care for right pyonephrosis.

## 2018-01-17 NOTE — ED Notes (Signed)
ED TO INPATIENT HANDOFF REPORT  Name/Age/Gender Emily Velazquez 67 y.o. female  Code Status   Home/SNF/Other Home  Chief Complaint Abdominal Pain   Level of Care/Admitting Diagnosis ED Disposition    ED Disposition Condition [facility] Area: [place] [[id number]]  Level of Care: Med-Surg [16]  Diagnosis: Severe sepsis [facility]) [[id number]]  Admitting Physician: Lance Coon [[id number]]  Attending Physician: Lance Coon [phone]  Estimated length of stay: past midnight tomorrow  Certification:: I certify this patient will need inpatient services for at least 2 midnights  PT Class (Do Not Modify): Inpatient [101]  PT Acc Code (Do Not Modify): Private [1]       Medical History Past Medical History:  Diagnosis Date  . Back spasm    VOCAL CORD  . Edema   . Gout   . Heart murmur   . History of kidney stones   . Hypertension   . IBS (irritable bowel syndrome)   . OAB (overactive bladder)   . PAF (paroxysmal atrial fibrillation) ([place])   . Rosacea     Allergies Allergies  Allergen Reactions  . Ace Inhibitors Itching  . Codeine Sulfate Itching        . Latex Itching and Other (See Comments)    Gloves   . Levofloxacin Other (See Comments)    Gastritis.  Onset 08/03/1999.  Marland Kitchen Shellfish-Derived Products Other (See Comments)    Unknown, showed up on allergy test  . Sulfa Antibiotics Itching    IV Location/Drains/Wounds Patient Lines/Drains/Airways Status   Active Line/Drains/Airways    Name:   Placement date:   Placement time:   Site:   Days:   Peripheral IV 05/23/17 Right Wrist   05/23/17    0709    Wrist   239   Peripheral IV 01/17/18 Right Antecubital   01/17/18    1823    Antecubital   less than 1   Nephrostomy Right 10.2 Fr.   01/17/18    2113    Right   less than 1   Incision (Closed) 10/26/16 Eye Right   10/26/16    1037     448          Labs/Imaging Results for orders placed or performed during the hospital encounter  of 01/17/18 (from the past 48 hour(s))  Urinalysis, Complete w Microscopic     Status: Abnormal   Collection Time: 01/17/18  5:22 PM  Result Value Ref Range   Color, Urine AMBER (A) YELLOW    Comment: BIOCHEMICALS MAY BE AFFECTED BY COLOR   APPearance CLOUDY (A) CLEAR   Specific Gravity, Urine 1.018 1.005 - 1.030   pH 5.0 5.0 - 8.0   Glucose, UA NEGATIVE NEGATIVE mg/dL   Hgb urine dipstick MODERATE (A) NEGATIVE   Bilirubin Urine NEGATIVE NEGATIVE   Ketones, ur NEGATIVE NEGATIVE mg/dL   Protein, ur 100 (A) NEGATIVE mg/dL   Nitrite NEGATIVE NEGATIVE   Leukocytes, UA MODERATE (A) NEGATIVE   RBC / HPF 11-20 0 - 5 RBC/hpf   WBC, UA >50 (H) 0 - 5 WBC/hpf   Bacteria, UA RARE (A) NONE SEEN   Squamous Epithelial / LPF 6-10 0 - 5    Comment: Performed at [facility], [address] [place], [place] [address]  Lipase, blood     Status: None   Collection Time: 01/17/18  5:29 PM  Result Value Ref Range   Lipase 22 11 - 51 U/L    Comment:  Performed at [facility], [address] [place], [place] [address]  Comprehensive metabolic panel     Status: Abnormal   Collection Time: 01/17/18  5:29 PM  Result Value Ref Range   Sodium 133 (L) 135 - 145 mmol/L   Potassium 2.9 (L) 3.5 - 5.1 mmol/L   Chloride 97 (L) 98 - 111 mmol/L   CO2 25 22 - 32 mmol/L   Glucose, Bld 133 (H) 70 - 99 mg/dL   BUN 29 (H) 8 - 23 mg/dL   Creatinine, Ser 1.91 (H) 0.44 - 1.00 mg/dL   Calcium 8.3 (L) 8.9 - 10.3 mg/dL   Total Protein 7.6 6.5 - 8.1 g/dL   Albumin 3.2 (L) 3.5 - 5.0 g/dL   AST 27 15 - 41 U/L   ALT 16 0 - 44 U/L   Alkaline Phosphatase 68 38 - 126 U/L   Total Bilirubin 4.4 (H) 0.3 - 1.2 mg/dL   GFR calc non Af Amer 26 (L) >60 mL/min   GFR calc Af Amer 30 (L) >60 mL/min    Comment: (NOTE) The eGFR has been calculated using the CKD EPI equation. This calculation has not been validated in all clinical situations. eGFR's persistently <60 mL/min signify possible Chronic  Kidney Disease.    Anion gap 11 5 - 15    Comment: Performed at [facility], [address] [place], [place] [address]  CBC     Status: Abnormal   Collection Time: 01/17/18  5:29 PM  Result Value Ref Range   WBC 12.4 (H) 3.6 - 11.0 K/uL   RBC 4.21 3.80 - 5.20 MIL/uL   Hemoglobin 13.5 12.0 - 16.0 g/dL   HCT 38.6 35.0 - 47.0 %   MCV 91.7 80.0 - 100.0 fL   MCH 32.0 26.0 - 34.0 pg   MCHC 34.9 32.0 - 36.0 g/dL   RDW 15.0 (H) 11.5 - 14.5 %   Platelets 164 150 - 440 K/uL    Comment: Performed at [facility], [address] [place], [place] [address]  Procalcitonin     Status: None   Collection Time: 01/17/18  5:29 PM  Result Value Ref Range   Procalcitonin 4.09 ng/mL    Comment:        Interpretation: PCT > 2 ng/mL: Systemic infection (sepsis) is likely, unless other causes are known. (NOTE)       Sepsis PCT Algorithm           Lower Respiratory Tract                                      Infection PCT Algorithm    ----------------------------     ----------------------------         PCT < 0.25 ng/mL                PCT < 0.10 ng/mL         Strongly encourage             Strongly discourage   discontinuation of antibiotics    initiation of antibiotics    ----------------------------     -----------------------------       PCT 0.25 - 0.50 ng/mL            PCT 0.10 - 0.25 ng/mL               OR       >  80% decrease in PCT            Discourage initiation of                                            antibiotics      Encourage discontinuation           of antibiotics    ----------------------------     -----------------------------         PCT >= 0.50 ng/mL              PCT 0.26 - 0.50 ng/mL               AND       <80% decrease in PCT              Encourage initiation of                                             antibiotics       Encourage continuation           of antibiotics    ----------------------------     -----------------------------        PCT  >= 0.50 ng/mL                  PCT > 0.50 ng/mL               AND         increase in PCT                  Strongly encourage                                      initiation of antibiotics    Strongly encourage escalation           of antibiotics                                     -----------------------------                                           PCT <= 0.25 ng/mL                                                 OR                                        > 80% decrease in PCT                                     Discontinue / Do not initiate  antibiotics Performed at [facility], [place], [place] [address]   Lactic acid, plasma     Status: None   Collection Time: 01/17/18  6:24 PM  Result Value Ref Range   Lactic Acid, Venous 1.5 0.5 - 1.9 mmol/L    Comment: Performed at [facility], [address] [place], [place] [address]   Ct Abdomen Pelvis Wo Contrast  Result Date: 01/17/2018 CLINICAL DATA:  Abdominal pain with nausea and vomiting and diarrhea EXAM: CT ABDOMEN AND PELVIS WITHOUT CONTRAST TECHNIQUE: Multidetector CT imaging of the abdomen and pelvis was performed following the standard protocol without IV contrast. COMPARISON:  Radiograph 07/02/2013, CT 10/27/2010 FINDINGS: Lower chest: Lung bases demonstrate atelectasis at the left base. No pleural effusion. Borderline cardiomegaly. Small distal esophageal hiatal hernia. Hepatobiliary: No focal liver abnormality is seen. No gallstones, gallbladder wall thickening, or biliary dilatation. Pancreas: Unremarkable. No pancreatic ductal dilatation or surrounding inflammatory changes. Spleen: Normal in size without focal abnormality. Adrenals/Urinary Tract: Adrenal glands are within normal limits. Enlarged right kidney with moderate to marked right perinephric fat stranding. Multiple calcified stones within the right kidney including a 2.3 cm stone in the right  renal pelvis, causing mild right hydronephrosis. Small foci of air in the bladder. Stomach/Bowel: Stomach nonenlarged. Postsurgical changes of the transverse colon. No bowel wall thickening. Vascular/Lymphatic: Nonaneurysmal aorta. Small retroperitoneal lymph nodes measuring up to 8 mm. Reproductive: Status post hysterectomy. No adnexal masses. Other: Negative for free air. Trace fluid in the pelvis. Edema and soft tissue stranding in the right retroperitoneum. Musculoskeletal: No acute or suspicious abnormality. Probable bone island at T11. IMPRESSION: 1. Moderate right perinephric fat stranding with mild right hydronephrosis. This appears to be secondary to a 2.4 cm stone within the right renal pelvis, encroaching the right UPJ. There are multiple additional stones present within the right kidney. 2. Small foci of air within the bladder, this may be due to recent manipulation. Electronically Signed   By: Donavan Foil M.D.   On: 01/17/2018 19:00    Pending Labs Unresulted Labs (From admission, onward)   Start     Ordered   01/17/18 1953  Urine culture  Add-on,   AD     01/17/18 1952   01/17/18 1911  Blood Culture (routine x 2)  BLOOD CULTURE X 2,   STAT     01/17/18 1910   Signed and Held  HIV antibody (Routine Testing)  Once,   R     Signed and Held   Signed and Held  Basic metabolic panel  Tomorrow morning,   R     Signed and Held   Signed and Held  CBC  Tomorrow morning,   R     Signed and Held      Vitals/Pain Today's Vitals   01/17/18 2230 01/17/18 2300 01/17/18 2307 01/17/18 2307  BP: 137/63 136/62    Pulse: 98 97    Resp: (!) 21 19    Temp:      TempSrc:      SpO2: 95% 95%    Weight:      Height:      PainSc:   5  5     Isolation Precautions No active isolations  Medications Medications  sodium chloride 0.9 % bolus 1,000 mL (has no administration in time range)  ondansetron (ZOFRAN-ODT) disintegrating tablet 4 mg (4 mg Oral Given 01/17/18 1726)  fentaNYL (SUBLIMAZE)  injection 100 mcg (100 mcg Intravenous Given 01/17/18 1834)  haloperidol  lactate (HALDOL) injection 2.5 mg (2.5 mg Intravenous Given 01/17/18 1832)  sodium chloride 0.9 % bolus 1,000 mL (0 mLs Intravenous Stopped 01/17/18 2009)  cefTRIAXone (ROCEPHIN) 1 g in sodium chloride 0.9 % 100 mL IVPB (0 g Intravenous Stopped 01/17/18 2108)  midazolam (VERSED) injection (1 mg Intravenous Given 01/17/18 2103)  fentaNYL (SUBLIMAZE) injection (50 mcg Intravenous Given 01/17/18 2103)  iopamidol (ISOVUE-300) 61 % injection 30 mL (10 mLs Other Contrast Given 01/17/18 2116)    Mobility walks

## 2018-01-17 NOTE — ED Notes (Addendum)
Pt taken to IR at this time for neph tube placement. Husband remains at side.

## 2018-01-17 NOTE — ED Notes (Signed)
Spoke with urology and they report the pt will go to the OR today for nephrostomy tube. Pt updated on plan of care. Pt reports improvement in her pain.

## 2018-01-17 NOTE — Progress Notes (Signed)
Pharmacy Antibiotic Note  Emily Velazquez is a 67 y.o. female admitted on 01/17/2018 with sepsis.  Pharmacy has been consulted for meropenem dosing.  Plan: Meropenem 1 gram q 12 hours ordered.  Height: 5\' 3"  (160 cm) Weight: 210 lb (95.3 kg) IBW/kg (Calculated) : 52.4  Temp (24hrs), Avg:99.5 F (37.5 C), Min:98.5 F (36.9 C), Max:100.9 F (38.3 C)  Recent Labs  Lab 01/17/18 1729 01/17/18 1824  WBC 12.4*  --   CREATININE 1.91*  --   LATICACIDVEN  --  1.5    Estimated Creatinine Clearance: 31.4 mL/min (A) (by C-G formula based on SCr of 1.91 mg/dL (H)).    Allergies  Allergen Reactions  . Ace Inhibitors Itching  . Codeine Sulfate Itching        . Latex Itching and Other (See Comments)    Gloves   . Levofloxacin Other (See Comments)    Gastritis.  Onset 08/03/1999.  Marland Kitchen Shellfish-Derived Products Other (See Comments)    Unknown, showed up on allergy test  . Sulfa Antibiotics Itching    Antimicrobials this admission: Ceftriaxone 7/24 x1, meropenem 7/25  >>    >>   Dose adjustments this admission:   Microbiology results: 7/24 BCx: pending 7/24 UCx: pending       7/24 UA: LE(+) NO2(-)  WBC >50  Thank you for allowing pharmacy to be a part of this patient's care.  Grigor Lipschutz S 01/17/2018 11:54 PM

## 2018-01-17 NOTE — ED Notes (Signed)
Pt up to use restroom, denies dizziness or CP/SOB.

## 2018-01-17 NOTE — ED Triage Notes (Signed)
Patient presents to the ED with abdominal pain, nausea and vomiting as well as diarrhea since early Monday morning.  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  Patient has Afib.  Patient states pain is lower abdomen.

## 2018-01-17 NOTE — Consult Note (Signed)
Urology Consult  I have been asked to see the patient by Dr. Mable Paris, for evaluation and management of right hydronephrosis.  Chief Complaint: right flank pain  History of Present Illness: Emily Velazquez is a 67 y.o. year old female who presented to the emergency room with right flank pain and several days of feeling very unwell, n/v, dysuria  She was febrile in the emergency room in the 100.9 and tachycardic.  She underwent further evaluation including a UA concerning for urinary tract infection as well as a noncontrast CT scan revealing a 2.4 cm right kidney stone extending into the U PJ with significant surrounding inflammation/stranding as well as mild hydronephrosis.  In addition, she was noted to have gas within her bladder.  Case was discussed with Dr. Jarvis Newcomer from interventional radiology who agreed to place a right-sided percutaneous nephrostomy tube in the setting of urinary obstruction and signs and symptoms consistent with sepsis.   We discussed alternatives including stent placement, but given that she would eventually need a percutaneous procedure to treat her stone definitively, we elected to place a nephrostomy tube based on the size of the stone and future intervention to minimize the number of procedures.  This was placed without complication.   Notably, she mentions that she was very aware of the stone.  She was followed by Dr. [name].  She was waiting to have the stone treated until she had addressed her A. fib and underwent cardiac ablation.  She in fact is scheduled to see me in clinic on Tuesday to establish care for this very reason.  Today, she reports that she is feeling much better than yesterday.  She continues to have right flank pain but this is improved.  She is now hungry and asking to eat.   Past Medical History:  Diagnosis Date  . Back spasm    VOCAL CORD  . Edema   . Gout   . Heart murmur   . History of kidney stones   . Hypertension   . IBS (irritable  bowel syndrome)   . OAB (overactive bladder)   . Rosacea     Past Surgical History:  Procedure Laterality Date  . CARDIOVERSION N/A 05/23/2017   Procedure: CARDIOVERSION;  Surgeon: [name], MD;  Location: [facility] ORS;  Service: Cardiovascular;  Laterality: N/A;  . CARDIOVERSION N/A 06/22/2017   Procedure: CARDIOVERSION;  Surgeon: [name], MD;  Location: [facility] ORS;  Service: Cardiovascular;  Laterality: N/A;  . CATARACT EXTRACTION W/PHACO Right 10/26/2016   Procedure: CATARACT EXTRACTION PHACO AND INTRAOCULAR LENS PLACEMENT ([place]) suture placed in right eye at end of procedure;  Surgeon: [name], MD;  Location: [facility] ORS;  Service: Ophthalmology;  Laterality: Right;  [place]  01:21 AP% 23.7 CDE 36.24 Fluid pack lot # [id number]  . COLON SURGERY     RESECTION  . JOINT REPLACEMENT     BIL TKR  . REPLACEMENT TOTAL KNEE BILATERAL    . TEE WITHOUT CARDIOVERSION N/A 08/24/2017   Procedure: TRANSESOPHAGEAL ECHOCARDIOGRAM (TEE);  Surgeon: [name], MD;  Location: [facility] ORS;  Service: Cardiovascular;  Laterality: N/A;  . VAGINAL HYSTERECTOMY  1997   complete    Home Medications:  Current Meds  Medication Sig  . acetaminophen (TYLENOL) 500 MG tablet Take 1,000 mg by mouth 2 (two) times daily as needed for mild pain.  Marland Kitchen amiodarone (PACERONE) 200 MG tablet Take 200 mg by mouth daily.  Marland Kitchen amLODipine (NORVASC) 5 MG tablet Take 5  mg daily by mouth.  Marland Kitchen apixaban (ELIQUIS) 5 MG TABS tablet Take 5 mg 2 (two) times daily by mouth.  . estradiol (ESTRACE) 0.5 MG tablet Take 0.5 mg by mouth daily.  . metoprolol tartrate (LOPRESSOR) 100 MG tablet Take 1 tablet (100 mg total) by mouth 2 (two) times daily. One half tablet twice per day    Allergies:  Allergies  Allergen Reactions  . Ace Inhibitors Itching  . Codeine Sulfate Itching        . Latex Itching and Other (See Comments)    Gloves   . Levofloxacin Other (See Comments)    Gastritis.  Onset 08/03/1999.  Marland Kitchen  Shellfish-Derived Products Other (See Comments)    Unknown, showed up on allergy test  . Sulfa Antibiotics Itching    Family History  Problem Relation Age of Onset  . Breast cancer Daughter 30    Social History:  reports that she has never smoked. She has never used smokeless tobacco. She reports that she does not drink alcohol or use drugs.  ROS: A complete review of systems was performed.  All systems are negative except for pertinent findings as noted.  Physical Exam:  Vital signs in last 24 hours: Temp:  [98.5 F (36.9 C)-100.9 F (38.3 C)] 100.9 F (38.3 C) (07/24 1910) Pulse Rate:  [115-133] 118 (07/24 1910) Resp:  [18-20] 18 (07/24 1800) BP: (143-148)/(52-71) 143/52 (07/24 1800) SpO2:  [95 %-97 %] 95 % (07/24 1800) Weight:  [210 lb (95.3 kg)] 210 lb (95.3 kg) (07/24 1721) Constitutional:  Alert and oriented, No acute distress.  Husband at bedside. HEENT: [place] AT, moist mucus membranes.  Trachea midline, no masses MsK: Trace bilateral lower extremity edema GI: Abd is soft, nontender, nondistended, no abdominal masses GU: Right nephrostomy tube in place draining mildly bloody urine with few scant clots Skin: No rashes, bruises or suspicious lesions Neurologic: Grossly intact, no focal deficits, moving all 4 extremities Psychiatric: Normal mood and affect   Laboratory Data:  Recent Labs    01/17/18 1729  WBC 12.4*  HGB 13.5  HCT 38.6   Recent Labs    01/17/18 1729  NA 133*  K 2.9*  CL 97*  CO2 25  GLUCOSE 133*  BUN 29*  CREATININE 1.91*  CALCIUM 8.3*   Component     Latest Ref Rng & Units 01/17/2018  Color, Urine     YELLOW AMBER (A)  Appearance     CLEAR CLOUDY (A)  Specific Gravity, Urine     1.005 - 1.030 1.018  pH     5.0 - 8.0 5.0  Glucose     NEGATIVE mg/dL NEGATIVE  Hgb urine dipstick     NEGATIVE MODERATE (A)  Bilirubin Urine     NEGATIVE NEGATIVE  Ketones, ur     NEGATIVE mg/dL NEGATIVE  Protein     NEGATIVE mg/dL 100 (A)   Nitrite     NEGATIVE NEGATIVE  Leukocytes, UA     NEGATIVE MODERATE (A)  RBC / HPF     0 - 5 RBC/hpf 11-20  WBC, UA     0 - 5 WBC/hpf >50 (H)  Bacteria, UA     NONE SEEN RARE (A)  Squamous Epithelial / LPF     0 - 5 6-10    Radiologic Imaging: Ct Abdomen Pelvis Wo Contrast  Result Date: 01/17/2018 CLINICAL DATA:  Abdominal pain with nausea and vomiting and diarrhea EXAM: CT ABDOMEN AND PELVIS WITHOUT CONTRAST TECHNIQUE: Multidetector CT imaging of  the abdomen and pelvis was performed following the standard protocol without IV contrast. COMPARISON:  Radiograph 07/02/2013, CT 10/27/2010 FINDINGS: Lower chest: Lung bases demonstrate atelectasis at the left base. No pleural effusion. Borderline cardiomegaly. Small distal esophageal hiatal hernia. Hepatobiliary: No focal liver abnormality is seen. No gallstones, gallbladder wall thickening, or biliary dilatation. Pancreas: Unremarkable. No pancreatic ductal dilatation or surrounding inflammatory changes. Spleen: Normal in size without focal abnormality. Adrenals/Urinary Tract: Adrenal glands are within normal limits. Enlarged right kidney with moderate to marked right perinephric fat stranding. Multiple calcified stones within the right kidney including a 2.3 cm stone in the right renal pelvis, causing mild right hydronephrosis. Small foci of air in the bladder. Stomach/Bowel: Stomach nonenlarged. Postsurgical changes of the transverse colon. No bowel wall thickening. Vascular/Lymphatic: Nonaneurysmal aorta. Small retroperitoneal lymph nodes measuring up to 8 mm. Reproductive: Status post hysterectomy. No adnexal masses. Other: Negative for free air. Trace fluid in the pelvis. Edema and soft tissue stranding in the right retroperitoneum. Musculoskeletal: No acute or suspicious abnormality. Probable bone island at T11. IMPRESSION: 1. Moderate right perinephric fat stranding with mild right hydronephrosis. This appears to be secondary to a 2.4 cm stone  within the right renal pelvis, encroaching the right UPJ. There are multiple additional stones present within the right kidney. 2. Small foci of air within the bladder, this may be due to recent manipulation. Electronically Signed   By: Donavan Foil M.D.   On: 01/17/2018 19:00    CT scan personally reviewed, agree with above assessment.  Impression/Assessment:  66 year old female presenting with sepsis of urinary source related to a 2.4 cm partially obstructing right renal pelvic stone extending into the UPJ.  She is now status post emergent easement of a right percutaneous nephrostomy tube by interventional radiology last night.    Overall today, she seems to be improving clinically.   Her fever curve is improving and her creatinine is trending down.    Cultures are negative x 12 hours.   WBC also improving.  Plan:  -Continue to flush nephrostomy tube per interventional radiology -For acute kidney injury, avoid nephrotoxic agents and continue -Continue broad-spectrum IV antibiotics, adjust based on urine culture and sensitivity data -Adjust antibiotics as appropriate based on this -We discussed that she should follow-up with me as scheduled on Tuesday to discuss right PCNL to be performed once her infection is completely cleared  Urology will sign off for the time being, please page with any questions or concerns.  Outpatient follow-up has been arranged.   Hollice Espy,  MD

## 2018-01-18 ENCOUNTER — Other Ambulatory Visit: Payer: Medicare Other

## 2018-01-18 ENCOUNTER — Other Ambulatory Visit: Payer: Self-pay

## 2018-01-18 DIAGNOSIS — R6520 Severe sepsis without septic shock: Secondary | ICD-10-CM

## 2018-01-18 DIAGNOSIS — A419 Sepsis, unspecified organism: Secondary | ICD-10-CM

## 2018-01-18 LAB — CBC
HEMATOCRIT: 33.7 % — AB (ref 35.0–47.0)
Hemoglobin: 11.9 g/dL — ABNORMAL LOW (ref 12.0–16.0)
MCH: 31.8 pg (ref 26.0–34.0)
MCHC: 35.4 g/dL (ref 32.0–36.0)
MCV: 90 fL (ref 80.0–100.0)
PLATELETS: 129 10*3/uL — AB (ref 150–440)
RBC: 3.74 MIL/uL — AB (ref 3.80–5.20)
RDW: 14.6 % — AB (ref 11.5–14.5)
WBC: 8.7 10*3/uL (ref 3.6–11.0)

## 2018-01-18 LAB — BASIC METABOLIC PANEL
Anion gap: 7 (ref 5–15)
BUN: 26 mg/dL — ABNORMAL HIGH (ref 8–23)
CHLORIDE: 105 mmol/L (ref 98–111)
CO2: 27 mmol/L (ref 22–32)
Calcium: 7.6 mg/dL — ABNORMAL LOW (ref 8.9–10.3)
Creatinine, Ser: 1.66 mg/dL — ABNORMAL HIGH (ref 0.44–1.00)
GFR, EST AFRICAN AMERICAN: 36 mL/min — AB (ref >60)
GFR, EST NON AFRICAN AMERICAN: 31 mL/min — AB (ref >60)
Glucose, Bld: 133 mg/dL — ABNORMAL HIGH (ref 70–99)
POTASSIUM: 3.1 mmol/L — AB (ref 3.5–5.1)
SODIUM: 139 mmol/L (ref 135–145)

## 2018-01-18 MED ORDER — ONDANSETRON HCL 4 MG/2ML IJ SOLN
4.0000 mg | Freq: Four times a day (QID) | INTRAMUSCULAR | Status: DC | PRN
Start: 2018-01-18 — End: 2018-01-20

## 2018-01-18 MED ORDER — ACETAMINOPHEN 650 MG RE SUPP: 650.0000 mg | Freq: Four times a day (QID) | RECTAL | Status: DC | PRN

## 2018-01-18 MED ORDER — OXYCODONE HCL 5 MG PO TABS
5.0000 mg | ORAL_TABLET | ORAL | Status: DC | PRN
  Administered 2018-01-18 – 2018-01-20 (×3): 5 mg via ORAL
  Filled 2018-01-18 (×3): qty 1

## 2018-01-18 MED ORDER — ONDANSETRON HCL 4 MG PO TABS: 4.0000 mg | ORAL_TABLET | Freq: Four times a day (QID) | ORAL | Status: DC | PRN

## 2018-01-18 MED ORDER — SODIUM CHLORIDE 0.9 % IV SOLN
INTRAVENOUS | Status: DC
  Administered 2018-01-18 – 2018-01-20 (×6): via INTRAVENOUS

## 2018-01-18 MED ORDER — METOPROLOL TARTRATE 50 MG PO TABS
100.0000 mg | ORAL_TABLET | Freq: Two times a day (BID) | ORAL | Status: DC
  Administered 2018-01-18 – 2018-01-20 (×5): 100 mg via ORAL
  Filled 2018-01-18 (×5): qty 2

## 2018-01-18 MED ORDER — ACETAMINOPHEN 325 MG PO TABS
650.0000 mg | ORAL_TABLET | Freq: Four times a day (QID) | ORAL | Status: DC | PRN
  Administered 2018-01-19: 650 mg via ORAL
  Filled 2018-01-18: qty 2

## 2018-01-18 MED ORDER — POTASSIUM CHLORIDE 20 MEQ PO PACK
40.0000 meq | PACK | Freq: Once | ORAL | Status: AC
  Administered 2018-01-18: 40 meq via ORAL
  Filled 2018-01-18: qty 2

## 2018-01-18 MED ORDER — AMIODARONE HCL 200 MG PO TABS
200.0000 mg | ORAL_TABLET | Freq: Every day | ORAL | Status: DC
  Administered 2018-01-18 – 2018-01-20 (×3): 200 mg via ORAL
  Filled 2018-01-18 (×3): qty 1

## 2018-01-18 MED ORDER — APIXABAN 5 MG PO TABS: 5.0000 mg | ORAL_TABLET | Freq: Two times a day (BID) | ORAL | Status: DC

## 2018-01-18 NOTE — Progress Notes (Signed)
[facility] [place] NAME: Emily Velazquez    MR#:  [id number]  DATE OF BIRTH:  1950-12-19  SUBJECTIVE:  CHIEF COMPLAINT:   Chief Complaint  Patient presents with  . Abdominal Pain  Patient is feeling better, no complaints, husband at the bedside  REVIEW OF SYSTEMS:  CONSTITUTIONAL: No fever, fatigue or weakness.  EYES: No blurred or double vision.  EARS, NOSE, AND THROAT: No tinnitus or ear pain.  RESPIRATORY: No cough, shortness of breath, wheezing or hemoptysis.  CARDIOVASCULAR: No chest pain, orthopnea, edema.  GASTROINTESTINAL: No nausea, vomiting, diarrhea or abdominal pain.  GENITOURINARY: No dysuria, hematuria.  ENDOCRINE: No polyuria, nocturia,  HEMATOLOGY: No anemia, easy bruising or bleeding SKIN: No rash or lesion. MUSCULOSKELETAL: No joint pain or arthritis.   NEUROLOGIC: No tingling, numbness, weakness.  PSYCHIATRY: No anxiety or depression.   ROS  DRUG ALLERGIES:   Allergies  Allergen Reactions  . Ace Inhibitors Itching  . Codeine Sulfate Itching        . Latex Itching and Other (See Comments)    Gloves   . Levofloxacin Other (See Comments)    Gastritis.  Onset 08/03/1999.  Marland Kitchen Shellfish-Derived Products Other (See Comments)    Unknown, showed up on allergy test  . Sulfa Antibiotics Itching    VITALS:  Blood pressure (!) 120/55, pulse 77, temperature 99.3 F (37.4 C), temperature source Oral, resp. rate 20, height 5\' 3"  (1.6 m), weight 95.3 kg (210 lb), SpO2 91 %.  PHYSICAL EXAMINATION:  GENERAL:  67 y.o.-year-old patient lying in the bed with no acute distress.  EYES: Pupils equal, round, reactive to light and accommodation. No scleral icterus. Extraocular muscles intact.  HEENT: Head atraumatic, normocephalic. Oropharynx and nasopharynx clear.  NECK:  Supple, no jugular venous distention. No thyroid enlargement, no tenderness.  LUNGS: Normal breath sounds bilaterally, no wheezing, rales,rhonchi or crepitation.  No use of accessory muscles of respiration.  CARDIOVASCULAR: S1, S2 normal. No murmurs, rubs, or gallops.  ABDOMEN: Soft, nontender, nondistended. Bowel sounds present. No organomegaly or mass.  EXTREMITIES: No pedal edema, cyanosis, or clubbing.  NEUROLOGIC: Cranial nerves II through XII are intact. Muscle strength 5/5 in all extremities. Sensation intact. Gait not checked.  PSYCHIATRIC: The patient is alert and oriented x 3.  SKIN: No obvious rash, lesion, or ulcer.   Physical Exam LABORATORY PANEL:   CBC Recent Labs  Lab 01/18/18 0328  WBC 8.7  HGB 11.9*  HCT 33.7*  PLT 129*   ------------------------------------------------------------------------------------------------------------------  Chemistries  Recent Labs  Lab 01/17/18 1729 01/18/18 0328  NA 133* 139  K 2.9* 3.1*  CL 97* 105  CO2 25 27  GLUCOSE 133* 133*  BUN 29* 26*  CREATININE 1.91* 1.66*  CALCIUM 8.3* 7.6*  AST 27  --   ALT 16  --   ALKPHOS 68  --   BILITOT 4.4*  --    ------------------------------------------------------------------------------------------------------------------  Cardiac Enzymes No results for input(s): TROPONINI in the last 168 hours. ------------------------------------------------------------------------------------------------------------------  RADIOLOGY:  Ct Abdomen Pelvis Wo Contrast  Result Date: 01/17/2018 CLINICAL DATA:  Abdominal pain with nausea and vomiting and diarrhea EXAM: CT ABDOMEN AND PELVIS WITHOUT CONTRAST TECHNIQUE: Multidetector CT imaging of the abdomen and pelvis was performed following the standard protocol without IV contrast. COMPARISON:  Radiograph 07/02/2013, CT 10/27/2010 FINDINGS: Lower chest: Lung bases demonstrate atelectasis at the left base. No pleural effusion. Borderline cardiomegaly. Small distal esophageal hiatal hernia. Hepatobiliary: No focal liver abnormality is seen. No gallstones,  gallbladder wall thickening, or biliary dilatation.  Pancreas: Unremarkable. No pancreatic ductal dilatation or surrounding inflammatory changes. Spleen: Normal in size without focal abnormality. Adrenals/Urinary Tract: Adrenal glands are within normal limits. Enlarged right kidney with moderate to marked right perinephric fat stranding. Multiple calcified stones within the right kidney including a 2.3 cm stone in the right renal pelvis, causing mild right hydronephrosis. Small foci of air in the bladder. Stomach/Bowel: Stomach nonenlarged. Postsurgical changes of the transverse colon. No bowel wall thickening. Vascular/Lymphatic: Nonaneurysmal aorta. Small retroperitoneal lymph nodes measuring up to 8 mm. Reproductive: Status post hysterectomy. No adnexal masses. Other: Negative for free air. Trace fluid in the pelvis. Edema and soft tissue stranding in the right retroperitoneum. Musculoskeletal: No acute or suspicious abnormality. Probable bone island at T11. IMPRESSION: 1. Moderate right perinephric fat stranding with mild right hydronephrosis. This appears to be secondary to a 2.4 cm stone within the right renal pelvis, encroaching the right UPJ. There are multiple additional stones present within the right kidney. 2. Small foci of air within the bladder, this may be due to recent manipulation. Electronically Signed   By: Donavan Foil M.D.   On: 01/17/2018 19:00   Ir Nephrostomy Placement Right  Result Date: 01/18/2018 CLINICAL DATA:  History of nephrolithiasis and lithotripsy. Flank pain x4 days, septic, clinical concern of pyonephrosis. EXAM: RIGHT PERCUTANEOUS NEPHROSTOMY CATHETER PLACEMENT UNDER ULTRASOUND AND FLUOROSCOPIC GUIDANCE FLUOROSCOPY TIME:  1 minutes, 20 mgy TECHNIQUE: The procedure, risks (including but not limited to bleeding, infection, organ damage Right), benefits, and alternatives were explained to the patient. Questions regarding the procedure were encouraged and answered. The patient understands and consents to the procedure. flank  region prepped with Betadine, draped in usual sterile fashion, infiltrated locally with 1% lidocaine. Patient was already receiving adequate prophylactic antibiotic coverage. Intravenous Fentanyl and Versed were administered as conscious sedation during continuous monitoring of the patient's level of consciousness and physiological / cardiorespiratory status by the radiology RN, with a total moderate sedation time of 15 minutes. Under real-time ultrasound guidance, a 21-gauge trocar needle was advanced into a posterior midpole calyx. Ultrasound image documentation was saved. Urine spontaneously returned through the needle. Needle was exchanged over a guidewire for transitional dilator. Contrast injection confirmed appropriate positioning. Catheter was exchanged over a guidewire for a 10 French pigtail catheter, formed centrally within the right renal collecting system. Contrast injection confirms appropriate positioning and patency. Catheter secured externally with 0 Prolene suture and placed to external drain bag. COMPLICATIONS: COMPLICATIONS none IMPRESSION: 1. Technically successful right percutaneous nephrostomy catheter placement. Electronically Signed   By: Lucrezia Europe M.D.   On: 01/18/2018 11:15    ASSESSMENT AND PLAN:  *Acute Severe sepsis Resolving due to right-sided pyelonephritis, acute hydronephrosis secondary to kidney stone Continue sepsis protocol, empiric meropenem, follow-up on cultures  *Acute right-sided pyelonephritis Stable Secondary to acute hydronephrosis due to kidney stone Plan of care as stated above  *Acute right-sided hydronephrosis secondary to kidney stone Status post nephrostomy tube placement Urology following Continue empiric antibiotics, follow-up on cultures, strict I&O monitoring, CBC/BMP daily, continue close medical monitoring  *Acute kidney injury  Secondary to above  Resolving  Avoid nephrotoxic agents, strict I&O monitoring, BMP daily   *Chronic  hypertension Stable Vitals per routine and make changes as per necessary  *History of paroxysmal A. Fib Hold Eliquis for now given bloody drainage from nephrostomy tube placement  *Acute hypokalemia Replete with p.o. potassium and check magnesium level   All the records are reviewed and case  discussed with Care Management/Social Workerr. Management plans discussed with the patient, family and they are in agreement.  CODE STATUS:full  TOTAL TIME TAKING CARE OF THIS PATIENT: 35 minutes.     POSSIBLE D/C IN 35 DAYS, DEPENDING ON CLINICAL CONDITION.   Avel Peace Emily Velazquez M.D on 01/18/2018   Between 7am to 6pm - Pager - [phone]  After 6pm go to [email or web address] - password [email or web address]  Office  [phone]  CC: Primary care physician; Tracie Harrier, MD  Note: This dictation was prepared with Dragon dictation along with smaller phrase technology. Any transcriptional errors that result from this process are unintentional.

## 2018-01-19 LAB — CBC
HCT: 30.5 % — ABNORMAL LOW (ref 35.0–47.0)
Hemoglobin: 10.6 g/dL — ABNORMAL LOW (ref 12.0–16.0)
MCH: 31.9 pg (ref 26.0–34.0)
MCHC: 34.9 g/dL (ref 32.0–36.0)
MCV: 91.4 fL (ref 80.0–100.0)
PLATELETS: 142 10*3/uL — AB (ref 150–440)
RBC: 3.34 MIL/uL — ABNORMAL LOW (ref 3.80–5.20)
RDW: 15.2 % — ABNORMAL HIGH (ref 11.5–14.5)
WBC: 8.5 10*3/uL (ref 3.6–11.0)

## 2018-01-19 LAB — BASIC METABOLIC PANEL
Anion gap: 6 (ref 5–15)
BUN: 24 mg/dL — AB (ref 8–23)
CALCIUM: 7.7 mg/dL — AB (ref 8.9–10.3)
CO2: 25 mmol/L (ref 22–32)
CREATININE: 1.59 mg/dL — AB (ref 0.44–1.00)
Chloride: 107 mmol/L (ref 98–111)
GFR calc Af Amer: 38 mL/min — ABNORMAL LOW (ref >60)
GFR, EST NON AFRICAN AMERICAN: 33 mL/min — AB (ref >60)
Glucose, Bld: 106 mg/dL — ABNORMAL HIGH (ref 70–99)
Potassium: 3.6 mmol/L (ref 3.5–5.1)
SODIUM: 138 mmol/L (ref 135–145)

## 2018-01-19 LAB — HIV ANTIBODY (ROUTINE TESTING W REFLEX): HIV SCREEN 4TH GENERATION: NONREACTIVE

## 2018-01-19 LAB — MAGNESIUM: Magnesium: 1.7 mg/dL (ref 1.7–2.4)

## 2018-01-19 MED ORDER — LOPERAMIDE HCL 2 MG PO CAPS
2.0000 mg | ORAL_CAPSULE | Freq: Four times a day (QID) | ORAL | Status: DC | PRN
  Administered 2018-01-19 – 2018-01-20 (×2): 2 mg via ORAL
  Filled 2018-01-19 (×2): qty 1

## 2018-01-19 NOTE — Care Management Important Message (Signed)
Copy of signed IM left with patient in room.  

## 2018-01-19 NOTE — Progress Notes (Signed)
[facility] [place] NAME: Emily Velazquez    MR#:  [id number]  DATE OF BIRTH:  1950-10-31  SUBJECTIVE:  CHIEF COMPLAINT:   Chief Complaint  Patient presents with  . Abdominal Pain  Patient feeling better, no events overnight, percutaneous nephrostomy tube bloody drainage has resolved  REVIEW OF SYSTEMS:  CONSTITUTIONAL: No fever, fatigue or weakness.  EYES: No blurred or double vision.  EARS, NOSE, AND THROAT: No tinnitus or ear pain.  RESPIRATORY: No cough, shortness of breath, wheezing or hemoptysis.  CARDIOVASCULAR: No chest pain, orthopnea, edema.  GASTROINTESTINAL: No nausea, vomiting, diarrhea or abdominal pain.  GENITOURINARY: No dysuria, hematuria.  ENDOCRINE: No polyuria, nocturia,  HEMATOLOGY: No anemia, easy bruising or bleeding SKIN: No rash or lesion. MUSCULOSKELETAL: No joint pain or arthritis.   NEUROLOGIC: No tingling, numbness, weakness.  PSYCHIATRY: No anxiety or depression.   ROS  DRUG ALLERGIES:   Allergies  Allergen Reactions  . Ace Inhibitors Itching  . Codeine Sulfate Itching        . Latex Itching and Other (See Comments)    Gloves   . Levofloxacin Other (See Comments)    Gastritis.  Onset 08/03/1999.  Marland Kitchen Shellfish-Derived Products Other (See Comments)    Unknown, showed up on allergy test  . Sulfa Antibiotics Itching    VITALS:  Blood pressure (!) 129/59, pulse 73, temperature 100 F (37.8 C), temperature source Oral, resp. rate 17, height 5\' 3"  (1.6 m), weight 95.3 kg (210 lb), SpO2 94 %.  PHYSICAL EXAMINATION:  GENERAL:  67 y.o.-year-old patient lying in the bed with no acute distress.  EYES: Pupils equal, round, reactive to light and accommodation. No scleral icterus. Extraocular muscles intact.  HEENT: Head atraumatic, normocephalic. Oropharynx and nasopharynx clear.  NECK:  Supple, no jugular venous distention. No thyroid enlargement, no tenderness.  LUNGS: Normal breath sounds bilaterally, no  wheezing, rales,rhonchi or crepitation. No use of accessory muscles of respiration.  CARDIOVASCULAR: S1, S2 normal. No murmurs, rubs, or gallops.  ABDOMEN: Soft, nontender, nondistended. Bowel sounds present. No organomegaly or mass.  EXTREMITIES: No pedal edema, cyanosis, or clubbing.  NEUROLOGIC: Cranial nerves II through XII are intact. Muscle strength 5/5 in all extremities. Sensation intact. Gait not checked.  PSYCHIATRIC: The patient is alert and oriented x 3.  SKIN: No obvious rash, lesion, or ulcer.   Physical Exam LABORATORY PANEL:   CBC Recent Labs  Lab 01/19/18 0553  WBC 8.5  HGB 10.6*  HCT 30.5*  PLT 142*   ------------------------------------------------------------------------------------------------------------------  Chemistries  Recent Labs  Lab 01/17/18 1729  01/19/18 0553  NA 133*   < > 138  K 2.9*   < > 3.6  CL 97*   < > 107  CO2 25   < > 25  GLUCOSE 133*   < > 106*  BUN 29*   < > 24*  CREATININE 1.91*   < > 1.59*  CALCIUM 8.3*   < > 7.7*  MG  --   --  1.7  AST 27  --   --   ALT 16  --   --   ALKPHOS 68  --   --   BILITOT 4.4*  --   --    < > = values in this interval not displayed.   ------------------------------------------------------------------------------------------------------------------  Cardiac Enzymes No results for input(s): TROPONINI in the last 168 hours. ------------------------------------------------------------------------------------------------------------------  RADIOLOGY:  Ct Abdomen Pelvis Wo Contrast  Result Date: 01/17/2018 CLINICAL DATA:  Abdominal  pain with nausea and vomiting and diarrhea EXAM: CT ABDOMEN AND PELVIS WITHOUT CONTRAST TECHNIQUE: Multidetector CT imaging of the abdomen and pelvis was performed following the standard protocol without IV contrast. COMPARISON:  Radiograph 07/02/2013, CT 10/27/2010 FINDINGS: Lower chest: Lung bases demonstrate atelectasis at the left base. No pleural effusion. Borderline  cardiomegaly. Small distal esophageal hiatal hernia. Hepatobiliary: No focal liver abnormality is seen. No gallstones, gallbladder wall thickening, or biliary dilatation. Pancreas: Unremarkable. No pancreatic ductal dilatation or surrounding inflammatory changes. Spleen: Normal in size without focal abnormality. Adrenals/Urinary Tract: Adrenal glands are within normal limits. Enlarged right kidney with moderate to marked right perinephric fat stranding. Multiple calcified stones within the right kidney including a 2.3 cm stone in the right renal pelvis, causing mild right hydronephrosis. Small foci of air in the bladder. Stomach/Bowel: Stomach nonenlarged. Postsurgical changes of the transverse colon. No bowel wall thickening. Vascular/Lymphatic: Nonaneurysmal aorta. Small retroperitoneal lymph nodes measuring up to 8 mm. Reproductive: Status post hysterectomy. No adnexal masses. Other: Negative for free air. Trace fluid in the pelvis. Edema and soft tissue stranding in the right retroperitoneum. Musculoskeletal: No acute or suspicious abnormality. Probable bone island at T11. IMPRESSION: 1. Moderate right perinephric fat stranding with mild right hydronephrosis. This appears to be secondary to a 2.4 cm stone within the right renal pelvis, encroaching the right UPJ. There are multiple additional stones present within the right kidney. 2. Small foci of air within the bladder, this may be due to recent manipulation. Electronically Signed   By: Donavan Foil M.D.   On: 01/17/2018 19:00   Ir Nephrostomy Placement Right  Result Date: 01/18/2018 CLINICAL DATA:  History of nephrolithiasis and lithotripsy. Flank pain x4 days, septic, clinical concern of pyonephrosis. EXAM: RIGHT PERCUTANEOUS NEPHROSTOMY CATHETER PLACEMENT UNDER ULTRASOUND AND FLUOROSCOPIC GUIDANCE FLUOROSCOPY TIME:  1 minutes, 20 mgy TECHNIQUE: The procedure, risks (including but not limited to bleeding, infection, organ damage Right), benefits, and  alternatives were explained to the patient. Questions regarding the procedure were encouraged and answered. The patient understands and consents to the procedure. flank region prepped with Betadine, draped in usual sterile fashion, infiltrated locally with 1% lidocaine. Patient was already receiving adequate prophylactic antibiotic coverage. Intravenous Fentanyl and Versed were administered as conscious sedation during continuous monitoring of the patient's level of consciousness and physiological / cardiorespiratory status by the radiology RN, with a total moderate sedation time of 15 minutes. Under real-time ultrasound guidance, a 21-gauge trocar needle was advanced into a posterior midpole calyx. Ultrasound image documentation was saved. Urine spontaneously returned through the needle. Needle was exchanged over a guidewire for transitional dilator. Contrast injection confirmed appropriate positioning. Catheter was exchanged over a guidewire for a 10 French pigtail catheter, formed centrally within the right renal collecting system. Contrast injection confirms appropriate positioning and patency. Catheter secured externally with 0 Prolene suture and placed to external drain bag. COMPLICATIONS: COMPLICATIONS none IMPRESSION: 1. Technically successful right percutaneous nephrostomy catheter placement. Electronically Signed   By: Lucrezia Europe M.D.   On: 01/18/2018 11:15    ASSESSMENT AND PLAN:  *AcuteSevere sepsis Resolved due to right-sided pyelonephritis, acute hydronephrosis secondary to kidney stone Continue sepsis protocol, empiric meropenem, and follow-up on cultures  *Acute right-sided pyelonephritis Resolving Secondary to acute hydronephrosis due to kidney stone Plan of care as stated above  *Acute right-sided hydronephrosis secondary to kidney stone S/p nephrostomy tube placement Urology input appreciated, have s/o, plans for follow-up with Dr. Erlene Quan on Tuesday to discuss PCNL once  infection has cleared  Continue empiric antibiotics, follow-up on cultures,BMP daily  *AKI Resolving slowly Secondary to above  Avoid nephrotoxic agents, strict I&O monitoring, BMP daily   *Chronic hypertension Stable Vitals per routine and make changes as per necessary  *History of paroxysmal A. Fib Eliquis on hold for bloody drainage from nephrostomy tube placement which is currently clearing up nicely  *Acute hypokalemia Repleted   Disposition to home once cultures obtained, possible discharge over the weekend with follow-up with urology status post discharge for Tuesday appointment  All the records are reviewed and case discussed with Care Management/Social Workerr. Management plans discussed with the patient, family and they are in agreement.  CODE STATUS: full  TOTAL TIME TAKING CARE OF THIS PATIENT: 40 minutes.     POSSIBLE D/C IN 2-3 DAYS, DEPENDING ON CLINICAL CONDITION.   Avel Peace Salary M.D on 01/19/2018   Between 7am to 6pm - Pager - [phone]  After 6pm go to [email or web address] - password [email or web address] [facility]  Office  [phone]  CC: Primary care physician; Tracie Harrier, MD  Note: This dictation was prepared with Dragon dictation along with smaller phrase technology. Any transcriptional errors that result from this process are unintentional.

## 2018-01-20 LAB — CBC
HCT: 29.4 % — ABNORMAL LOW (ref 35.0–47.0)
Hemoglobin: 10.3 g/dL — ABNORMAL LOW (ref 12.0–16.0)
MCH: 32.1 pg (ref 26.0–34.0)
MCHC: 34.9 g/dL (ref 32.0–36.0)
MCV: 91.8 fL (ref 80.0–100.0)
PLATELETS: 170 10*3/uL (ref 150–440)
RBC: 3.2 MIL/uL — AB (ref 3.80–5.20)
RDW: 15.3 % — AB (ref 11.5–14.5)
WBC: 8.3 10*3/uL (ref 3.6–11.0)

## 2018-01-20 LAB — BASIC METABOLIC PANEL
Anion gap: 6 (ref 5–15)
BUN: 18 mg/dL (ref 8–23)
CALCIUM: 7.6 mg/dL — AB (ref 8.9–10.3)
CO2: 24 mmol/L (ref 22–32)
CREATININE: 1.4 mg/dL — AB (ref 0.44–1.00)
Chloride: 110 mmol/L (ref 98–111)
GFR calc Af Amer: 44 mL/min — ABNORMAL LOW (ref >60)
GFR, EST NON AFRICAN AMERICAN: 38 mL/min — AB (ref >60)
GLUCOSE: 113 mg/dL — AB (ref 70–99)
POTASSIUM: 3.3 mmol/L — AB (ref 3.5–5.1)
SODIUM: 140 mmol/L (ref 135–145)

## 2018-01-20 LAB — URINE CULTURE

## 2018-01-20 MED ORDER — CEFUROXIME AXETIL 250 MG PO TABS: 250.0000 mg | ORAL_TABLET | Freq: Two times a day (BID) | ORAL | 0 refills | Status: DC

## 2018-01-20 NOTE — Discharge Summary (Signed)
[facility] NAME: Emily Velazquez    MR#:  [id number]  DATE OF BIRTH:  Oct 15, 1950  DATE OF ADMISSION:  01/17/2018 ADMITTING PHYSICIAN: Lance Coon, MD  DATE OF DISCHARGE: 01/20/2018  PRIMARY CARE PHYSICIAN: Tracie Harrier, MD    ADMISSION DIAGNOSIS:  Kidney stone [N20.0] Pyelonephritis [N12] Hydronephrosis of right kidney [N13.30] Sepsis, due to unspecified organism ([place]) [A41.9]  DISCHARGE DIAGNOSIS:  Principal Problem:   Severe sepsis ([place]) Active Problems:   Right kidney stone   Acute pyelonephritis   HTN (hypertension)   PAF (paroxysmal atrial fibrillation) (HCC)   AKI (acute kidney injury) ([place])   SECONDARY DIAGNOSIS:   Past Medical History:  Diagnosis Date  . Back spasm    VOCAL CORD  . Edema   . Gout   . Heart murmur   . History of kidney stones   . Hypertension   . IBS (irritable bowel syndrome)   . OAB (overactive bladder)   . PAF (paroxysmal atrial fibrillation) ([place])   . Rosacea     HOSPITAL COURSE:   67 year old female with past medical history of paroxysmal atrial fibrillation status post ablation, irritable bowel syndrome, hypertension, history of nephrolithiasis, gout who presents to the hospital due to fever, flank pain and noted to have sepsis secondary to urinary tract infection with nephrolithiasis.  1.  Sepsis-patient met criteria admission given her fever, tachycardia and urinalysis positive for UTI. -The source of patient's sepsis was pyelonephritis and UTI.  Initially empirically patient was placed on IV meropenem, her urine cultures have grew out pansensitive E. coli.  Her blood cultures remain negative.  She is now afebrile and hemodynamically stable.  -She is being discharged on oral Ceftin for additional 7 days.  2.  Acute pyelonephritis-this is a source of patient's sepsis.  This was secondary to nephrolithiasis. -Patient was treated supportively with IV fluids, antibiotics as  mentioned above.  She has improved and is currently afebrile hemodynamic stable and therefore being discharged home on oral antibiotics.  3.  Nephrolithiasis with right-sided hydronephrosis-this was a source of patient's acute [place] and sepsis. - Patient was seen by interventional radiology and is status post right-sided percutaneous nephrostomy tube placement.  She has improved.  She is now being discharged home with follow-up with urology as an outpatient.  Patient follows up with Dr. Erlene Quan and discussed the plan with her. -Plan is for further lithotripsy and removal of her stones as an outpatient.  4.  Acute kidney injury-secondary to the nephrolithiasis/pyelonephritis. -Improved and resolved with IV fluid hydration.  5.  History of paroxysmal atrial fibrillation-patient is status post ablation. -Her Eliquis was held due to the need for percutaneous nephrostomy tube but it has been resumed now. -Patient will continue amiodarone and Lopressor.  6.  Essential hypertension-patient will resume her Norvasc, Lopressor.   DISCHARGE CONDITIONS:   Stable  CONSULTS OBTAINED:  Treatment Team:  Hollice Espy, MD  DRUG ALLERGIES:   Allergies  Allergen Reactions  . Ace Inhibitors Itching  . Codeine Sulfate Itching        . Latex Itching and Other (See Comments)    Gloves   . Levofloxacin Other (See Comments)    Gastritis.  Onset 08/03/1999.  Marland Kitchen Shellfish-Derived Products Other (See Comments)    Unknown, showed up on allergy test  . Sulfa Antibiotics Itching    DISCHARGE MEDICATIONS:   Allergies as of 01/20/2018      Reactions   Ace Inhibitors Itching   Codeine  Sulfate Itching       Latex Itching, Other (See Comments)   Gloves    Levofloxacin Other (See Comments)   Gastritis.  Onset 08/03/1999.   Shellfish-derived Products Other (See Comments)   Unknown, showed up on allergy test   Sulfa Antibiotics Itching      Medication List    TAKE these medications   acetaminophen  500 MG tablet Commonly known as:  TYLENOL Take 1,000 mg by mouth 2 (two) times daily as needed for mild pain.   amiodarone 200 MG tablet Commonly known as:  PACERONE Take 200 mg by mouth daily.   amLODipine 5 MG tablet Commonly known as:  NORVASC Take 5 mg daily by mouth.   cefUROXime 250 MG tablet Commonly known as:  CEFTIN Take 1 tablet (250 mg total) by mouth 2 (two) times daily with a meal for 7 days.   ELIQUIS 5 MG Tabs tablet Generic drug:  apixaban Take 5 mg 2 (two) times daily by mouth.   estradiol 0.5 MG tablet Commonly known as:  ESTRACE Take 0.5 mg by mouth daily.   metoprolol tartrate 100 MG tablet Commonly known as:  LOPRESSOR Take 1 tablet (100 mg total) by mouth 2 (two) times daily. One half tablet twice per day         DISCHARGE INSTRUCTIONS:   DIET:  Cardiac diet  DISCHARGE CONDITION:  Stable  ACTIVITY:  Activity as tolerated  OXYGEN:  Home Oxygen: No.   Oxygen Delivery: room air  DISCHARGE LOCATION:  home   If you experience worsening of your admission symptoms, develop shortness of breath, life threatening emergency, suicidal or homicidal thoughts you must seek medical attention immediately by calling 911 or calling your MD immediately  if symptoms less severe.  You Must read complete instructions/literature along with all the possible adverse reactions/side effects for all the Medicines you take and that have been prescribed to you. Take any new Medicines after you have completely understood and accpet all the possible adverse reactions/side effects.   Please note  You were cared for by a hospitalist during your hospital stay. If you have any questions about your discharge medications or the care you received while you were in the hospital after you are discharged, you can call the unit and asked to speak with the hospitalist on call if the hospitalist that took care of you is not available. Once you are discharged, your primary care  physician will handle any further medical issues. Please note that NO REFILLS for any discharge medications will be authorized once you are discharged, as it is imperative that you return to your primary care physician (or establish a relationship with a primary care physician if you do not have one) for your aftercare needs so that they can reassess your need for medications and monitor your lab values.     Today   Patient's abdominal pain has improved.,  She denies any nausea vomiting or fever.  Will discharge home today.  VITAL SIGNS:  Blood pressure (!) 134/58, pulse 70, temperature 100.2 F (37.9 C), temperature source Oral, resp. rate 16, height '5\' 3"'  (1.6 m), weight 95.3 kg (210 lb), SpO2 92 %.  I/O:    Intake/Output Summary (Last 24 hours) at 01/20/2018 1339 Last data filed at 01/20/2018 1230 Gross per 24 hour  Intake 1591 ml  Output 1690 ml  Net -99 ml    PHYSICAL EXAMINATION:  GENERAL:  67 y.o.-year-old patient lying in the bed with no acute distress.  EYES: Pupils equal, round, reactive to light and accommodation. No scleral icterus. Extraocular muscles intact.  HEENT: Head atraumatic, normocephalic. Oropharynx and nasopharynx clear.  NECK:  Supple, no jugular venous distention. No thyroid enlargement, no tenderness.  LUNGS: Normal breath sounds bilaterally, no wheezing, rales,rhonchi. No use of accessory muscles of respiration.  CARDIOVASCULAR: S1, S2 normal. No murmurs, rubs, or gallops.  ABDOMEN: Soft, non-tender, non-distended. Bowel sounds present. No organomegaly or mass.  Right-sided percutaneous nephrostomy tube in place with yellow urine draining. EXTREMITIES: No pedal edema, cyanosis, or clubbing.  NEUROLOGIC: Cranial nerves II through XII are intact. No focal motor or sensory defecits b/l.  PSYCHIATRIC: The patient is alert and oriented x 3.  SKIN: No obvious rash, lesion, or ulcer.   DATA REVIEW:   CBC Recent Labs  Lab 01/20/18 0529  WBC 8.3  HGB 10.3*   HCT 29.4*  PLT 170    Chemistries  Recent Labs  Lab 01/17/18 1729  01/19/18 0553 01/20/18 0529  NA 133*   < > 138 140  K 2.9*   < > 3.6 3.3*  CL 97*   < > 107 110  CO2 25   < > 25 24  GLUCOSE 133*   < > 106* 113*  BUN 29*   < > 24* 18  CREATININE 1.91*   < > 1.59* 1.40*  CALCIUM 8.3*   < > 7.7* 7.6*  MG  --   --  1.7  --   AST 27  --   --   --   ALT 16  --   --   --   ALKPHOS 68  --   --   --   BILITOT 4.4*  --   --   --    < > = values in this interval not displayed.    Cardiac Enzymes No results for input(s): TROPONINI in the last 168 hours.  Microbiology Results  Results for orders placed or performed during the hospital encounter of 01/17/18  Urine culture     Status: Abnormal   Collection Time: 01/17/18  5:02 PM  Result Value Ref Range Status   Specimen Description   Final    URINE, RANDOM Performed at [facility], [address] [place], [place] [address]    Special Requests   Final    NONE Performed at [facility], [address] [place], [place] [address]    Culture >=100,000 COLONIES/mL ESCHERICHIA COLI (A)  Final   Report Status 01/20/2018 FINAL  Final   Organism ID, Bacteria ESCHERICHIA COLI (A)  Final      Susceptibility   Escherichia coli - MIC*    AMPICILLIN <=2 SENSITIVE Sensitive     CEFAZOLIN <=4 SENSITIVE Sensitive     CEFTRIAXONE <=1 SENSITIVE Sensitive     CIPROFLOXACIN <=0.25 SENSITIVE Sensitive     GENTAMICIN <=1 SENSITIVE Sensitive     IMIPENEM <=0.25 SENSITIVE Sensitive     NITROFURANTOIN <=16 SENSITIVE Sensitive     TRIMETH/SULFA <=20 SENSITIVE Sensitive     AMPICILLIN/SULBACTAM <=2 SENSITIVE Sensitive     PIP/TAZO <=4 SENSITIVE Sensitive     Extended ESBL NEGATIVE Sensitive     * >=100,000 COLONIES/mL ESCHERICHIA COLI  Blood Culture (routine x 2)     Status: None (Preliminary result)   Collection Time: 01/17/18  8:26 PM  Result Value Ref Range Status   Specimen Description BLOOD RIGHT ANTECUBITAL  Final    Special Requests   Final    BOTTLES  DRAWN AEROBIC AND ANAEROBIC Blood Culture adequate volume   Culture   Final    NO GROWTH 3 DAYS Performed at [facility], [address] [place], [place] [address]    Report Status PENDING  Incomplete    RADIOLOGY:  No results found.    Management plans discussed with the patient, family and they are in agreement.  CODE STATUS:     Code Status Orders  (From admission, onward)        Start     Ordered   01/18/18 0023  Full code  Continuous     01/18/18 0022    Code Status History    This patient has a current code status but no historical code status.      TOTAL TIME TAKING CARE OF THIS PATIENT: 40 minutes.    Henreitta Leber M.D on 01/20/2018 at 1:39 PM  Between 7am to 6pm - Pager - [phone]  After 6pm go to [email or web address] - Proofreader  [facility]  Office  [phone]  CC: Primary care physician; Tracie Harrier, MD

## 2018-01-21 LAB — BLOOD CULTURE ID PANEL (REFLEXED)
Acinetobacter baumannii: NOT DETECTED
CANDIDA GLABRATA: NOT DETECTED
CANDIDA KRUSEI: NOT DETECTED
CANDIDA PARAPSILOSIS: NOT DETECTED
CANDIDA TROPICALIS: NOT DETECTED
CARBAPENEM RESISTANCE: NOT DETECTED
Candida albicans: NOT DETECTED
ESCHERICHIA COLI: DETECTED — AB
Enterobacter cloacae complex: NOT DETECTED
Enterobacteriaceae species: DETECTED — AB
Enterococcus species: NOT DETECTED
Haemophilus influenzae: NOT DETECTED
KLEBSIELLA OXYTOCA: NOT DETECTED
KLEBSIELLA PNEUMONIAE: NOT DETECTED
Listeria monocytogenes: NOT DETECTED
Neisseria meningitidis: NOT DETECTED
PROTEUS SPECIES: NOT DETECTED
Pseudomonas aeruginosa: NOT DETECTED
SERRATIA MARCESCENS: NOT DETECTED
STAPHYLOCOCCUS AUREUS BCID: NOT DETECTED
STREPTOCOCCUS PNEUMONIAE: NOT DETECTED
Staphylococcus species: NOT DETECTED
Streptococcus agalactiae: NOT DETECTED
Streptococcus pyogenes: NOT DETECTED
Streptococcus species: NOT DETECTED

## 2018-01-21 NOTE — Progress Notes (Signed)
Noted by pharmacy that patients blood cultures also came positive with pan sensitive E.coli. Patient was treated with IV ABX in the hospital for 3-4 days and discharged on ceftin for 7 more days for E.coli in urine. No further trt changes recommended.

## 2018-01-21 NOTE — Progress Notes (Signed)
PHARMACY - PHYSICIAN COMMUNICATION CRITICAL VALUE ALERT - BLOOD CULTURE IDENTIFICATION (BCID)  Emily Velazquez is an 67 y.o. female who presented to [facility] on [facility]/17/2018 with a chief complaint of Severe sepsis secondary to E. Coli UTI  Assessment:  BCx now showing E. Coli   Name of physician (or Provider) Contacted: Dr. Tressia Miners  Current antibiotics: Patient Discharged on Cefuroxime 250mg  BID x 7 days.   Changes to prescribed antibiotics recommended:  Patient received IV abx from 7/24 to 7/28 and then discharged on 7 additional days of abx. Total days of Abx =11. After discussion with Dr. Scarlette Slice no changes will be made at this time.   Pernell Dupre, PharmD, BCPS Clinical Pharmacist 01/21/2018 7:05 PM

## 2018-01-23 ENCOUNTER — Ambulatory Visit: Payer: Medicare Other | Admitting: Urology

## 2018-01-23 ENCOUNTER — Other Ambulatory Visit: Payer: Self-pay | Admitting: Radiology

## 2018-01-23 ENCOUNTER — Telehealth: Payer: Self-pay

## 2018-01-23 ENCOUNTER — Encounter: Payer: Self-pay | Admitting: Urology

## 2018-01-23 VITALS — BP 131/69 | HR 64 | Ht 63.0 in | Wt 210.0 lb

## 2018-01-23 DIAGNOSIS — N200 Calculus of kidney: Secondary | ICD-10-CM | POA: Diagnosis not present

## 2018-01-23 DIAGNOSIS — N12 Tubulo-interstitial nephritis, not specified as acute or chronic: Secondary | ICD-10-CM

## 2018-01-23 MED ORDER — CEPHALEXIN 500 MG PO CAPS: 500.0000 mg | ORAL_CAPSULE | Freq: Four times a day (QID) | ORAL | 0 refills | Status: DC

## 2018-01-23 NOTE — Progress Notes (Signed)
01/23/2018 2:56 PM   Emily Velazquez [birth year] [id number]  Referring provider: Tracie Harrier, MD [address] [facility] [place], [place] [address]  Chief Complaint  Patient presents with  . Nephrolithiasis    New patient    HPI: 67 year old female who presents today for follow-up of a large right renal stone.  Emily Velazquez was previously followed by Dr. [name] for large right kidney stone.  She was considering surgical intervention after she underwent cardiac ablation for treatment of her A. fib.  Unfortunately, she presented to the emergency room on 01/17/2018 in florid sepsis from urinary source.  Blood and urine cultures grew E. coli.  CT scan showed a 2.4 cm right renal pelvic stone encroaching on the UPJ with moderate perinephric stranding and hydronephrosis.  She underwent emergent right percutaneous nephrostomy tube placement in the upper pole calyx.  She ultimately defervesced and was discharged home on oral antibiotics.  She reports that she continues to feel fatigued but is no longer having fevers or chills.  She is having intermittent hematuria from her nephrostomy tube with is clear today.  She is back on her Eliquis.  She does have baseline urinary symptoms and was also followed by Dr. [name] for stress urinary incontinence and urinary urgency.  PMH: Past Medical History:  Diagnosis Date  . Back spasm    VOCAL CORD  . Edema   . Gout   . Heart murmur   . History of kidney stones   . Hypertension   . IBS (irritable bowel syndrome)   . OAB (overactive bladder)   . PAF (paroxysmal atrial fibrillation) ([place])   . Rosacea     Surgical History: Past Surgical History:  Procedure Laterality Date  . CARDIOVERSION N/A 05/23/2017   Procedure: CARDIOVERSION;  Surgeon: [name], MD;  Location: [facility] ORS;  Service: Cardiovascular;  Laterality: N/A;  . CARDIOVERSION N/A 06/22/2017   Procedure: CARDIOVERSION;  Surgeon: [name], MD;  Location:  [facility] ORS;  Service: Cardiovascular;  Laterality: N/A;  . CATARACT EXTRACTION W/PHACO Right 10/26/2016   Procedure: CATARACT EXTRACTION PHACO AND INTRAOCULAR LENS PLACEMENT ([place]) suture placed in right eye at end of procedure;  Surgeon: [name], MD;  Location: [facility] ORS;  Service: Ophthalmology;  Laterality: Right;  [place]  01:21 AP% 23.7 CDE 36.24 Fluid pack lot # [id number]  . COLON SURGERY     RESECTION  . IR NEPHROSTOMY PLACEMENT RIGHT  01/17/2018  . JOINT REPLACEMENT     BIL TKR  . REPLACEMENT TOTAL KNEE BILATERAL    . TEE WITHOUT CARDIOVERSION N/A 08/24/2017   Procedure: TRANSESOPHAGEAL ECHOCARDIOGRAM (TEE);  Surgeon: [name], MD;  Location: [facility] ORS;  Service: Cardiovascular;  Laterality: N/A;  . VAGINAL HYSTERECTOMY  1997   complete    Home Medications:  Allergies as of 01/23/2018      Reactions   Ace Inhibitors Itching   Codeine Sulfate Itching       Latex Itching, Other (See Comments)   Gloves    Levofloxacin Other (See Comments)   Gastritis.  Onset 08/03/1999.   Shellfish-derived Products Other (See Comments)   Unknown, showed up on allergy test   Sulfa Antibiotics Itching      Medication List        Accurate as of 01/23/18  2:56 PM. Always use your most recent med list.          acetaminophen 500 MG tablet Commonly known as:  TYLENOL Take 1,000 mg by mouth  2 (two) times daily as needed for mild pain.   amiodarone 200 MG tablet Commonly known as:  PACERONE Take 200 mg by mouth daily.   amLODipine 5 MG tablet Commonly known as:  NORVASC Take 5 mg daily by mouth.   cefUROXime 250 MG tablet Commonly known as:  CEFTIN Take 1 tablet (250 mg total) by mouth 2 (two) times daily with a meal for 7 days.   ELIQUIS 5 MG Tabs tablet Generic drug:  apixaban Take 5 mg 2 (two) times daily by mouth.   estradiol 0.5 MG tablet Commonly known as:  ESTRACE Take 0.5 mg by mouth daily.   metoprolol tartrate 100 MG tablet Commonly known as:  LOPRESSOR Take  1 tablet (100 mg total) by mouth 2 (two) times daily. One half tablet twice per day       Allergies:  Allergies  Allergen Reactions  . Ace Inhibitors Itching  . Codeine Sulfate Itching        . Latex Itching and Other (See Comments)    Gloves   . Levofloxacin Other (See Comments)    Gastritis.  Onset 08/03/1999.  Marland Kitchen Shellfish-Derived Products Other (See Comments)    Unknown, showed up on allergy test  . Sulfa Antibiotics Itching    Family History: Family History  Problem Relation Age of Onset  . Breast cancer Daughter 36    Social History:  reports that she has never smoked. She has never used smokeless tobacco. She reports that she does not drink alcohol or use drugs.  ROS: UROLOGY Frequent Urination?: No Hard to postpone urination?: No Burning/pain with urination?: No Get up at night to urinate?: No Leakage of urine?: No Urine stream starts and stops?: No Trouble starting stream?: No Do you have to strain to urinate?: No Blood in urine?: No Urinary tract infection?: No Sexually transmitted disease?: No Injury to kidneys or bladder?: No Painful intercourse?: No Weak stream?: No Currently pregnant?: No Vaginal bleeding?: No Last menstrual period?: n  Gastrointestinal Nausea?: No Vomiting?: No Indigestion/heartburn?: No Diarrhea?: No Constipation?: No  Constitutional Fever: No Night sweats?: No Weight loss?: No Fatigue?: No  Skin Skin rash/lesions?: No Itching?: No  Eyes Blurred vision?: No Double vision?: No  Ears/Nose/Throat Sore throat?: No Sinus problems?: No  Hematologic/Lymphatic Swollen glands?: No Easy bruising?: No  Cardiovascular Leg swelling?: No Chest pain?: No  Respiratory Cough?: No Shortness of breath?: No  Endocrine Excessive thirst?: No  Musculoskeletal Back pain?: No Joint pain?: No  Neurological Headaches?: No Dizziness?: No  Psychologic Depression?: No Anxiety?: No  Physical Exam: BP 131/69   Pulse  64   Ht 5\' 3"  (1.6 m)   Wt 210 lb (95.3 kg)   BMI 37.20 kg/m   Constitutional:  Alert and oriented, No acute distress.  Accompanied by husband today. HEENT: [place] AT, moist mucus membranes.  Trachea midline, no masses. Cardiovascular: No clubbing, cyanosis, or edema. Respiratory: Normal respiratory effort, no increased work of breathing. GI: Abdomen is soft, nontender, nondistended, no abdominal masses GU: Right nephrostomy tube draining clear yellow urine.  Dressing clean dry and intact. Skin: No rashes, bruises or suspicious lesions. Neurologic: Grossly intact, no focal deficits, moving all 4 extremities. Psychiatric: Normal mood and affect.  Laboratory Data: Lab Results  Component Value Date   WBC 8.3 01/20/2018   HGB 10.3 (L) 01/20/2018   HCT 29.4 (L) 01/20/2018   MCV 91.8 01/20/2018   PLT 170 01/20/2018    Lab Results  Component Value Date   CREATININE  1.40 (H) 01/20/2018    Urinalysis n/a  Pertinent Imaging: CLINICAL DATA:  Abdominal pain with nausea and vomiting and diarrhea  EXAM: CT ABDOMEN AND PELVIS WITHOUT CONTRAST  TECHNIQUE: Multidetector CT imaging of the abdomen and pelvis was performed following the standard protocol without IV contrast.  COMPARISON:  Radiograph 07/02/2013, CT 10/27/2010  FINDINGS: Lower chest: Lung bases demonstrate atelectasis at the left base. No pleural effusion. Borderline cardiomegaly. Small distal esophageal hiatal hernia.  Hepatobiliary: No focal liver abnormality is seen. No gallstones, gallbladder wall thickening, or biliary dilatation.  Pancreas: Unremarkable. No pancreatic ductal dilatation or surrounding inflammatory changes.  Spleen: Normal in size without focal abnormality.  Adrenals/Urinary Tract: Adrenal glands are within normal limits. Enlarged right kidney with moderate to marked right perinephric fat stranding. Multiple calcified stones within the right kidney including a 2.3 cm stone in the right  renal pelvis, causing mild right hydronephrosis. Small foci of air in the bladder.  Stomach/Bowel: Stomach nonenlarged. Postsurgical changes of the transverse colon. No bowel wall thickening.  Vascular/Lymphatic: Nonaneurysmal aorta. Small retroperitoneal lymph nodes measuring up to 8 mm.  Reproductive: Status post hysterectomy. No adnexal masses.  Other: Negative for free air. Trace fluid in the pelvis. Edema and soft tissue stranding in the right retroperitoneum.  Musculoskeletal: No acute or suspicious abnormality. Probable bone island at T11.  IMPRESSION: 1. Moderate right perinephric fat stranding with mild right hydronephrosis. This appears to be secondary to a 2.4 cm stone within the right renal pelvis, encroaching the right UPJ. There are multiple additional stones present within the right kidney. 2. Small foci of air within the bladder, this may be due to recent manipulation.   Electronically Signed   By: Donavan Foil M.D.   On: 01/17/2018 19:00  CT scan was reviewed again personally today and with the patient and her husband.  IR nephrostomy tube placement imaging was also reviewed and it appears that the nephrostomy tube was placed in the right upper pole.  Assessment & Plan:    1. Right kidney stone Large 2.4 cm right renal stone which is partially obstructing status post emergent right percutaneous nephrostomy tube  We discussed given the size of the stone, strongly recommend right PCNL to remove the stone.  This is likely the nidus of her infection.  We discussed the procedure at length today including the preoperative, intraoperative, postoperative course.  All of her questions were answered.  We discussed the risk of surgery including risk of bleeding which could be severe and require blood transfusion, damage to surrounding structures, infection, need for multiple procedures and subsequent intervention, amongst others.  All questions were answered.    I will have her go to interventional radiology for second lower pole access to optimize access of the stone and minimize the number of procedures.  2. Pyelonephritis Growing E. coli in both urine and blood Given her bacteremia, will extend antibiotics for an additional week, changed to [place] to repeat voided urine culture prior to surgery to ensure the infection is cleared  Schedule right PCNL  Hollice Espy, MD  [facility] [address], [place], [place] [address] [phone]  I spent 25 min with this patient of which greater than 50% was spent in counseling and coordination of care with the patient.

## 2018-01-23 NOTE — Telephone Encounter (Signed)
Flagged on EMMI report for having unfilled prescriptions.  Called and spoke with patient.  She mentioned she was able to get her prescriptions filled and said the automated call did not pick up her answer correctly.  I apologized for that occurring and thanked her for the feedback.  No other concerns at this time.  I thanked her for her time and made her aware that she would receive one more automated call checking on her in the next few days.

## 2018-01-23 NOTE — Patient Instructions (Signed)
Percutaneous Nephrolithotomy Percutaneous nephrolithotomy is a procedure to remove kidney stones. Kidney stones are deposits that form inside your kidneys and can cause pain. You may need this procedure if:  You have large kidney stones. Kidney stones that are bigger than 2 cm (0.78 in) wide may require this procedure.  Your kidney stones are oddly shaped.  Other treatments have not been successful in helping the kidney stones to pass.  You have developed an infection due to the kidney stones.  Tell a health care provider about:  Any allergies you have.  All medicines you are taking, including vitamins, herbs, eye drops, creams, and over-the-counter medicines.  Any problems you or family members have had with anesthetic medicines.  Any blood disorders you have.  Any surgeries you have had.  Any medical conditions you have.  Whether you are pregnant or may be pregnant.  Whether you use any tobacco products, including cigarettes, chewing tobacco, or e-cigarettes. What are the risks? Generally, this is a safe procedure. However, problems may occur, including:  Infection.  Bleeding. This may include blood in your urine.  Allergic reactions to medicines.  Damage to other structures or organs.  Kidney damage.  Holes in the kidney. These often heal on their own.  Numbness or tingling in the affected area.  Sometimes, not all of the kidney stones are able to be removed with this procedure, so you may need a different procedure to remove them. What happens before the procedure?  Follow instructions from your health care provider about eating or drinking restrictions.  Ask your health care provider about: ? Changing or stopping your regular medicines. This is especially important if you are taking diabetes medicines or blood thinners. ? Taking medicines such as aspirin and ibuprofen. These medicines can thin your blood. Do not take these medicines before your procedure if  your health care provider instructs you not to.  Plan to have someone take you home after the procedure.  If you go home right after the procedure, plan to have someone with you for 24 hours.  You may have tests, including: ? Blood tests. ? Urine tests. ? Tests to check how your heart is working.  Ask your health care provider how your surgical site will be marked or identified.  You may be given antibiotic medicine to help prevent infection. What happens during the procedure?  To reduce your risk of infection: ? Your health care team will wash or sanitize their hands. ? Your skin will be washed with soap.  An IV tube will be inserted into one of your veins.  You will be given one or more of the following: ? A medicine to help you relax (sedative). ? A medicine to numb the area (local anesthetic). ? A medicine to make you fall asleep (general anesthetic). ? A medicine that is injected into your spine to numb the area below and slightly above the injection site (spinal anesthetic). ? A medicine that is injected into an area of your body to numb everything below the injection site (regional anesthetic).  A thin tube (catheter) will be put in your bladder to drain urine during and after the procedure.  Your surgeon will make a small cut (incision) in your lower back.  A tube will be inserted through the incision into your kidney.  Each kidney stone will be removed through this tube. Larger stones may need to be broken up with a high-intensity light beam (laser) or other tools.  If a kidney   stone left the kidney, your surgeon will bring it back to the kidney and then remove it through the tube.  After all of the stones have been removed, a kidney drain tube will be put in. This will help to drain any fluid that builds up while your kidney heals.  Part of the incision may be closed with stitches (sutures).  A bandage (dressing) will be placed over the incision area. The  procedure may vary among health care providers and hospitals. What happens after the procedure?  Your blood pressure, heart rate, breathing rate, and blood oxygen level will be monitored often until the medicines you were given have worn off.  You may be given medicine for pain.  You will be encouraged to walk. Walking helps to prevent blood clots.  You may be taught breathing exercises.  Do not drive for 24 hours if you received a sedative. This information is not intended to replace advice given to you by your health care provider. Make sure you discuss any questions you have with your health care provider. Document Released: 04/10/2009 Document Revised: 11/19/2015 Document Reviewed: 12/08/2014 Elsevier Interactive Patient Education  2018 [employer]  

## 2018-01-24 LAB — CULTURE, BLOOD (ROUTINE X 2): Special Requests: ADEQUATE

## 2018-01-31 ENCOUNTER — Other Ambulatory Visit: Payer: Self-pay | Admitting: Radiology

## 2018-01-31 ENCOUNTER — Telehealth: Payer: Self-pay | Admitting: Licensed Clinical Social Worker

## 2018-01-31 DIAGNOSIS — N200 Calculus of kidney: Secondary | ICD-10-CM

## 2018-01-31 NOTE — Telephone Encounter (Signed)
CSW contacted patient to follow up on patient's response to an EMMI call in which she chose option that she was feeling sad. CSW spoke with patient this afternoon and she denied having any feelings of sadness and that she has been doing well. She voiced that she did not care for automated voice mails and that they never are clear with what they say. CSW provided supportive listening. Patient had no further concerns or questions. Shela Leff MSW,LCSW [phone]

## 2018-02-14 ENCOUNTER — Ambulatory Visit: Payer: Medicare Other

## 2018-02-19 ENCOUNTER — Ambulatory Visit: Payer: Medicare Other

## 2018-02-20 ENCOUNTER — Encounter
Admission: RE | Admit: 2018-02-20 | Discharge: 2018-02-20 | Disposition: A | Discharge status: Home / Self Care | Payer: Medicare Other | Source: Ambulatory Visit | Attending: Urology | Admitting: Urology

## 2018-02-20 ENCOUNTER — Other Ambulatory Visit: Payer: Self-pay

## 2018-02-20 ENCOUNTER — Telehealth: Payer: Self-pay | Admitting: Radiology

## 2018-02-20 DIAGNOSIS — N200 Calculus of kidney: Secondary | ICD-10-CM | POA: Insufficient documentation

## 2018-02-20 DIAGNOSIS — Z79899 Other long term (current) drug therapy: Secondary | ICD-10-CM | POA: Insufficient documentation

## 2018-02-20 DIAGNOSIS — N12 Tubulo-interstitial nephritis, not specified as acute or chronic: Secondary | ICD-10-CM | POA: Insufficient documentation

## 2018-02-20 DIAGNOSIS — I10 Essential (primary) hypertension: Secondary | ICD-10-CM | POA: Diagnosis not present

## 2018-02-20 DIAGNOSIS — Z01812 Encounter for preprocedural laboratory examination: Secondary | ICD-10-CM | POA: Insufficient documentation

## 2018-02-20 HISTORY — DX: Fatty (change of) liver, not elsewhere classified: K76.0

## 2018-02-20 HISTORY — DX: Other specified postprocedural states: Z98.890

## 2018-02-20 LAB — CBC
HEMATOCRIT: 37.5 % (ref 35.0–47.0)
HEMOGLOBIN: 13.3 g/dL (ref 12.0–16.0)
MCH: 32.6 pg (ref 26.0–34.0)
MCHC: 35.5 g/dL (ref 32.0–36.0)
MCV: 91.6 fL (ref 80.0–100.0)
Platelets: 270 10*3/uL (ref 150–440)
RBC: 4.1 MIL/uL (ref 3.80–5.20)
RDW: 14.7 % — AB (ref 11.5–14.5)
WBC: 7.9 10*3/uL (ref 3.6–11.0)

## 2018-02-20 LAB — BASIC METABOLIC PANEL
ANION GAP: 11 (ref 5–15)
BUN: 16 mg/dL (ref 8–23)
CHLORIDE: 102 mmol/L (ref 98–111)
CO2: 26 mmol/L (ref 22–32)
Calcium: 9 mg/dL (ref 8.9–10.3)
Creatinine, Ser: 1.37 mg/dL — ABNORMAL HIGH (ref 0.44–1.00)
GFR, EST AFRICAN AMERICAN: 45 mL/min — AB (ref >60)
GFR, EST NON AFRICAN AMERICAN: 39 mL/min — AB (ref >60)
Glucose, Bld: 98 mg/dL (ref 70–99)
POTASSIUM: 3.5 mmol/L (ref 3.5–5.1)
SODIUM: 139 mmol/L (ref 135–145)

## 2018-02-20 LAB — URINALYSIS, ROUTINE W REFLEX MICROSCOPIC
Bilirubin Urine: NEGATIVE
Glucose, UA: NEGATIVE mg/dL
KETONES UR: NEGATIVE mg/dL
Nitrite: POSITIVE — AB
PH: 5 (ref 5.0–8.0)
PROTEIN: NEGATIVE mg/dL
Specific Gravity, Urine: 1.015 (ref 1.005–1.030)

## 2018-02-20 LAB — PROTIME-INR
INR: 1.14
Prothrombin Time: 14.5 seconds (ref 11.4–15.2)

## 2018-02-20 NOTE — Patient Instructions (Signed)
Your procedure is scheduled on: Wed. 02/28/18 Report to Day Surgery. To find out your arrival time please call [phone] between 1PM - 3PM on Tues.02/27/18  Remember: Instructions that are not followed completely may result in serious medical risk,  up to and including death, or upon the discretion of your surgeon and anesthesiologist your  surgery may need to be rescheduled.     _X__ 1. Do not eat food after midnight the night before your procedure.                 No gum chewing or hard candies. You may drink clear liquids up to 2 hours                 before you are scheduled to arrive for your surgery- DO not drink clear                 liquids within 2 hours of the start of your surgery.                 Clear Liquids include:  water, apple juice without pulp, clear carbohydrate                 drink such as Clearfast of Gatorade, Black Coffee or Tea (Do not add                 anything to coffee or tea).  __X__2.  On the morning of surgery brush your teeth with toothpaste and water, you                may rinse your mouth with mouthwash if you wish.  Do not swallow any toothpaste of mouthwash.     _X__ 3.  No Alcohol for 24 hours before or after surgery.   _X__ 4.  Do Not Smoke or use e-cigarettes For 24 Hours Prior to Your Surgery.                 Do not use any chewable tobacco products for at least 6 hours prior to                 surgery.  ____  5.  Bring all medications with you on the day of surgery if instructed.   __x  6.  Notify your doctor if there is any change in your medical condition      (cold, fever, infections).     Do not wear jewelry, make-up, hairpins, clips or nail polish. Do not wear lotions, powders, or perfumes. You may wear deodorant. Do not shave 48 hours prior to surgery. Men may shave face and neck. Do not bring valuables to the hospital.    [facility] is not responsible for any belongings or valuables.  Contacts, dentures  or bridgework may not be worn into surgery. Leave your suitcase in the car. After surgery it may be brought to your room. For patients admitted to the hospital, discharge time is determined by your treatment team.   Patients discharged the day of surgery will not be allowed to drive home.   Please read over the following fact sheets that you were given:    _x___ Take these medicines the morning of surgery with A SIP OF WATER:    1.amiodarone (PACERONE) 200 MG tablet  2. metoprolol tartrate (LOPRESSOR) 100 MG tablet  3.   4.  5.  6.  ____ Fleet Enema (as directed)   ____ Use CHG Soap as directed  ____ Use inhalers on the day of surgery  ____ Stop metformin 2 days prior to surgery    ____ Take 1/2 of usual insulin dose the night before surgery. No insulin the morning          of surgery.   __x__ Stop apixaban (ELIQUIS) 5 MG TABS tablet on 02/23/18  ____ Stop Anti-inflammatories on    ____ Stop supplements until after surgery.    ____ Bring C-Pap to the hospital.

## 2018-02-20 NOTE — Pre-Procedure Instructions (Signed)
Faxed request for H&P. 

## 2018-02-20 NOTE — Telephone Encounter (Signed)
Patient denies fever or pain & burning with urination. Reports gross hematuria greater than previously seen. Advised patient to call if fever develops, otherwise we will call back once urine culture results are available. Questions answered. Patient voices understanding.

## 2018-02-20 NOTE — Telephone Encounter (Signed)
-----   Message from Hollice Espy, MD sent at 02/20/2018  1:08 PM EDT ----- This patient's urine is suspicious for infection.  As long she is not having fevers, will hold off on treatment for the next couple of days until we know which antibiotic to give.  Please let her know.  Hollice Espy, MD

## 2018-02-21 ENCOUNTER — Telehealth: Payer: Self-pay | Admitting: Radiology

## 2018-02-21 ENCOUNTER — Other Ambulatory Visit: Payer: Self-pay | Admitting: Radiology

## 2018-02-21 DIAGNOSIS — N200 Calculus of kidney: Secondary | ICD-10-CM

## 2018-02-21 LAB — TYPE AND SCREEN
ABO/RH(D): A NEG
ANTIBODY SCREEN: NEGATIVE

## 2018-02-21 LAB — URINE CULTURE

## 2018-02-21 MED ORDER — CEPHALEXIN 500 MG PO CAPS: 500.0000 mg | ORAL_CAPSULE | Freq: Three times a day (TID) | ORAL | 0 refills | Status: DC

## 2018-02-21 NOTE — Telephone Encounter (Signed)
-----   Message from Hollice Espy, MD sent at 02/21/2018  2:29 PM EDT ----- This patient ended up growing mixed flora.  As a precaution preop, lets have her start Keflex 500 mg 3 times a day for 3 days prior to surgery.  Hollice Espy, MD

## 2018-02-21 NOTE — Telephone Encounter (Signed)
Notified patient of positive urine culture & script sent to pharmacy. Instructed patient to take 1 tablet three time daily beginning 02/25/2018 in preparation for surgery on 02/28/2018. Questions answered. Patient voices understanding.

## 2018-02-23 ENCOUNTER — Other Ambulatory Visit: Payer: Self-pay | Admitting: Radiology

## 2018-02-27 ENCOUNTER — Ambulatory Visit
Admission: RE | Admit: 2018-02-27 | Discharge: 2018-02-27 | Disposition: A | Discharge status: Home / Self Care | Payer: Medicare Other | Source: Ambulatory Visit | Attending: Urology | Admitting: Urology

## 2018-02-27 ENCOUNTER — Ambulatory Visit: Payer: Medicare Other

## 2018-02-27 DIAGNOSIS — N136 Pyonephrosis: Secondary | ICD-10-CM | POA: Diagnosis not present

## 2018-02-27 DIAGNOSIS — N200 Calculus of kidney: Secondary | ICD-10-CM | POA: Diagnosis not present

## 2018-02-27 DIAGNOSIS — Z436 Encounter for attention to other artificial openings of urinary tract: Secondary | ICD-10-CM | POA: Diagnosis present

## 2018-02-27 HISTORY — PX: IR NEPHROSTOMY PLACEMENT RIGHT: IMG6064

## 2018-02-27 HISTORY — DX: Calculus of kidney: N20.0

## 2018-02-27 MED ORDER — IOPAMIDOL (ISOVUE-300) INJECTION 61%
30.0000 mL | Freq: Once | INTRAVENOUS | Status: AC | PRN
  Administered 2018-02-27: 15 mL

## 2018-02-27 MED ORDER — MIDAZOLAM HCL 5 MG/5ML IJ SOLN
INTRAMUSCULAR | Status: AC
  Filled 2018-02-27: qty 5

## 2018-02-27 MED ORDER — MIDAZOLAM HCL 2 MG/2ML IJ SOLN
INTRAMUSCULAR | Status: AC | PRN
  Administered 2018-02-27: 1 mg via INTRAVENOUS

## 2018-02-27 MED ORDER — LIDOCAINE HCL (PF) 1 % IJ SOLN
INTRAMUSCULAR | Status: AC
  Filled 2018-02-27: qty 30

## 2018-02-27 MED ORDER — CEFAZOLIN SODIUM-DEXTROSE 2-4 GM/100ML-% IV SOLN
2.0000 g | INTRAVENOUS | Status: AC
  Administered 2018-02-28: 2 g via INTRAVENOUS

## 2018-02-27 MED ORDER — FENTANYL CITRATE (PF) 100 MCG/2ML IJ SOLN
INTRAMUSCULAR | Status: AC | PRN
  Administered 2018-02-27: 50 ug via INTRAVENOUS

## 2018-02-27 MED ORDER — SODIUM CHLORIDE 0.9 % IV SOLN
INTRAVENOUS | Status: DC
  Administered 2018-02-27: 13:00:00 via INTRAVENOUS

## 2018-02-27 MED ORDER — CEFAZOLIN SODIUM-DEXTROSE 2-4 GM/100ML-% IV SOLN
INTRAVENOUS | Status: AC
  Administered 2018-02-27: 2 g via INTRAVENOUS
  Filled 2018-02-27: qty 100

## 2018-02-27 MED ORDER — FENTANYL CITRATE (PF) 100 MCG/2ML IJ SOLN
INTRAMUSCULAR | Status: AC
  Filled 2018-02-27: qty 2

## 2018-02-27 MED ORDER — CEFAZOLIN SODIUM-DEXTROSE 2-4 GM/100ML-% IV SOLN
2.0000 g | INTRAVENOUS | Status: AC
  Administered 2018-02-27: 2 g via INTRAVENOUS

## 2018-02-27 NOTE — Discharge Instructions (Signed)
Dietary Guidelines to Help Prevent Kidney Stones Kidney stones are deposits of minerals and salts that form inside your kidneys. Your risk of developing kidney stones may be greater depending on your diet, your lifestyle, the medicines you take, and whether you have certain medical conditions. Most people can reduce their chances of developing kidney stones by following the instructions below. Depending on your overall health and the type of kidney stones you tend to develop, your dietitian may give you more specific instructions. What are tips for following this plan? Reading food labels  Choose foods with "no salt added" or "low-salt" labels. Limit your sodium intake to less than 1500 mg per day.  Choose foods with calcium for each meal and snack. Try to eat about 300 mg of calcium at each meal. Foods that contain 200-500 mg of calcium per serving include: ? 8 oz (237 ml) of milk, fortified nondairy milk, and fortified fruit juice. ? 8 oz (237 ml) of kefir, yogurt, and soy yogurt. ? 4 oz (118 ml) of tofu. ? 1 oz of cheese. ? 1 cup (300 g) of dried figs. ? 1 cup (91 g) of cooked broccoli. ? 1-3 oz can of sardines or mackerel.  Most people need 1000 to 1500 mg of calcium each day. Talk to your dietitian about how much calcium is recommended for you. Shopping  Buy plenty of fresh fruits and vegetables. Most people do not need to avoid fruits and vegetables, even if they contain nutrients that may contribute to kidney stones.  When shopping for convenience foods, choose: ? Whole pieces of fruit. ? Premade salads with dressing on the side. ? Low-fat fruit and yogurt smoothies.  Avoid buying frozen meals or prepared deli foods.  Look for foods with live cultures, such as yogurt and kefir. Cooking  Do not add salt to food when cooking. Place a salt shaker on the table and allow each person to add his or her own salt to taste.  Use vegetable protein, such as beans, textured vegetable  protein (TVP), or tofu instead of meat in pasta, casseroles, and soups. Meal planning  Eat less salt, if told by your dietitian. To do this: ? Avoid eating processed or premade food. ? Avoid eating fast food.  Eat less animal protein, including cheese, meat, poultry, or fish, if told by your dietitian. To do this: ? Limit the number of times you have meat, poultry, fish, or cheese each week. Eat a diet free of meat at least 2 days a week. ? Eat only one serving each day of meat, poultry, fish, or seafood. ? When you prepare animal protein, cut pieces into small portion sizes. For most meat and fish, one serving is about the size of one deck of cards.  Eat at least 5 servings of fresh fruits and vegetables each day. To do this: ? Keep fruits and vegetables on hand for snacks. ? Eat 1 piece of fruit or a handful of berries with breakfast. ? Have a salad and fruit at lunch. ? Have two kinds of vegetables at dinner.  Limit foods that are high in a substance called oxalate. These include: ? Spinach. ? Rhubarb. ? Beets. ? Potato chips and french fries. ? Nuts.  If you regularly take a diuretic medicine, make sure to eat at least 1-2 fruits or vegetables high in potassium each day. These include: ? Avocado. ? Banana. ? Orange, prune, carrot, or tomato juice. ? Baked potato. ? Cabbage. ? Beans and split  peas. General instructions  Drink enough fluid to keep your urine clear or pale yellow. This is the most important thing you can do.  Talk to your health care provider and dietitian about taking daily supplements. Depending on your health and the cause of your kidney stones, you may be advised: ? Not to take supplements with vitamin C. ? To take a calcium supplement. ? To take a daily probiotic supplement. ? To take other supplements such as magnesium, fish oil, or vitamin B6.  Take all medicines and supplements as told by your health care provider.  Limit alcohol intake to no more  than 1 drink a day for nonpregnant women and 2 drinks a day for men. One drink equals 12 oz of beer, 5 oz of wine, or 1 oz of hard liquor.  Lose weight if told by your health care provider. Work with your dietitian to find strategies and an eating plan that works best for you. What foods are not recommended? Limit your intake of the following foods, or as told by your dietitian. Talk to your dietitian about specific foods you should avoid based on the type of kidney stones and your overall health. Grains Breads. Bagels. Rolls. Baked goods. Salted crackers. Cereal. Pasta. Vegetables Spinach. Rhubarb. Beets. Canned vegetables. Emily Velazquez. Olives. Meats and other protein foods Nuts. Nut butters. Large portions of meat, poultry, or fish. Salted or cured meats. Deli meats. Hot dogs. Sausages. Dairy Cheese. Beverages Regular soft drinks. Regular vegetable juice. Seasonings and other foods Seasoning blends with salt. Salad dressings. Canned soups. Soy sauce. Ketchup. Barbecue sauce. Canned pasta sauce. Casseroles. Pizza. Lasagna. Frozen meals. Potato chips. [place] fries. Summary  You can reduce your risk of kidney stones by making changes to your diet.  The most important thing you can do is drink enough fluid. You should drink enough fluid to keep your urine clear or pale yellow.  Ask your health care provider or dietitian how much protein from animal sources you should eat each day, and also how much salt and calcium you should have each day. This information is not intended to replace advice given to you by your health care provider. Make sure you discuss any questions you have with your health care provider. Document Released: 10/08/2010 Document Revised: 05/24/2016 Document Reviewed: 05/24/2016 Elsevier Interactive Patient Education  2018 [name]. Percutaneous Nephrostomy, Care After This sheet gives you information about how to care for yourself after your procedure. Your health care  provider may also give you more specific instructions. If you have problems or questions, contact your health care provider. What can I expect after the procedure? After the procedure, it is common to have:  Some soreness where the nephrostomy tube was inserted (tube insertion site).  Blood-tinged drainage from the nephrostomy tube for the first 24 hours.  Follow these instructions at home: Activity  Return to your normal activities as told by your health care provider. Ask your health care provider what activities are safe for you.  Avoid activities that may cause the nephrostomy tubing to bend.  Do not take baths, swim, or use a hot tub until your health care provider approves. Ask your health care provider if you can take showers. Cover the nephrostomy tube dressing with a watertight covering when you take a shower.  Donot drive for 24 hours if you were given a medicine to help you relax (sedative). Care of the tube insertion site  Follow instructions from your health care provider about how to take  care of your tube insertion site. Make sure you: ? Wash your hands with soap and water before you change your bandage (dressing). If soap and water are not available, use hand sanitizer. ? Change your dressing as told by your health care provider. Be careful not to pull on the tube while removing the dressing. ? When you change the dressing, wash the skin around the tube, rinse well, and pat the skin dry.  Check the tube insertion area every day for signs of infection. Check for: ? More redness, swelling, or pain. ? More fluid or blood. ? Warmth. ? Pus or a bad smell. Care of the nephrostomy tube and drainage bag  Always keep the tubing, the leg bag, or the bedside drainage bags below the level of the kidney so that your urine drains freely.  When connecting your nephrostomy tube to a drainage bag, make sure that there are no kinks in the tubing and that your urine is draining  freely. You may want to use an elastic bandage to wrap any exposed tubing that goes from the nephrostomy tube to any of the connecting tubes.  At night, you may want to connect your nephrostomy tube or the leg bag to a larger bedside drainage bag.  Follow instructions from your health care provider about how to empty or change the drainage bag.  Empty the drainage bag when it becomes ? full.  Replace the drainage bag and any extension tubing that is connected to your nephrostomy tube every 3 weeks or as often as told by your health care provider. Your health care provider will explain how to change the drainage bag and extension tubing. General instructions  Take over-the-counter and prescription medicines only as told by your health care provider.  Keep all follow-up visits as told by your health care provider. This is important. Contact a health care provider if:  You have problems with any of the valves or tubing.  You have persistent pain or soreness in your back.  You have more redness, swelling, or pain around your tube insertion site.  You have more fluid or blood coming from your tube insertion site.  Your tube insertion site feels warm to the touch.  You have pus or a bad smell coming from your tube insertion site.  You have increased urine output or you feel burning when urinating. Get help right away if:  You have pain in your abdomen during the first week.  You have chest pain or have trouble breathing.  You have a new appearance of blood in your urine.  You have a fever or chills.  You have back pain that is not relieved by your medicine.  You have decreased urine output.  Your nephrostomy tube comes out. This information is not intended to replace advice given to you by your health care provider. Make sure you discuss any questions you have with your health care provider. Document Released: 02/04/2004 Document Revised: 03/25/2016 Document Reviewed:  03/25/2016 Elsevier Interactive Patient Education  [employer].

## 2018-02-28 ENCOUNTER — Ambulatory Visit: Payer: Medicare Other

## 2018-02-28 ENCOUNTER — Other Ambulatory Visit: Payer: Self-pay

## 2018-02-28 ENCOUNTER — Ambulatory Visit: Admission: RE | Admit: 2018-02-28 | Payer: Medicare Other | Source: Ambulatory Visit

## 2018-02-28 ENCOUNTER — Ambulatory Visit: Payer: Medicare Other | Admitting: Anesthesiology

## 2018-02-28 ENCOUNTER — Encounter
Admission: RE | Disposition: A | Discharge status: Home / Self Care | Payer: Self-pay | Source: Ambulatory Visit | Attending: Urology

## 2018-02-28 ENCOUNTER — Encounter: Payer: Self-pay | Admitting: Emergency Medicine

## 2018-02-28 ENCOUNTER — Observation Stay
Admission: RE | Admit: 2018-02-28 | Discharge: 2018-03-01 | Disposition: A | Discharge status: Home / Self Care | Payer: Medicare Other | Source: Ambulatory Visit | Attending: Urology | Admitting: Urology

## 2018-02-28 DIAGNOSIS — Z7901 Long term (current) use of anticoagulants: Secondary | ICD-10-CM | POA: Insufficient documentation

## 2018-02-28 DIAGNOSIS — N179 Acute kidney failure, unspecified: Secondary | ICD-10-CM | POA: Insufficient documentation

## 2018-02-28 DIAGNOSIS — Z90710 Acquired absence of both cervix and uterus: Secondary | ICD-10-CM | POA: Insufficient documentation

## 2018-02-28 DIAGNOSIS — Z885 Allergy status to narcotic agent status: Secondary | ICD-10-CM | POA: Diagnosis not present

## 2018-02-28 DIAGNOSIS — Z9841 Cataract extraction status, right eye: Secondary | ICD-10-CM | POA: Diagnosis not present

## 2018-02-28 DIAGNOSIS — Z882 Allergy status to sulfonamides status: Secondary | ICD-10-CM | POA: Diagnosis not present

## 2018-02-28 DIAGNOSIS — N183 Chronic kidney disease, stage 3 (moderate): Secondary | ICD-10-CM | POA: Insufficient documentation

## 2018-02-28 DIAGNOSIS — N200 Calculus of kidney: Secondary | ICD-10-CM

## 2018-02-28 DIAGNOSIS — Z8619 Personal history of other infectious and parasitic diseases: Secondary | ICD-10-CM | POA: Diagnosis not present

## 2018-02-28 DIAGNOSIS — Z888 Allergy status to other drugs, medicaments and biological substances status: Secondary | ICD-10-CM | POA: Diagnosis not present

## 2018-02-28 DIAGNOSIS — I480 Paroxysmal atrial fibrillation: Secondary | ICD-10-CM | POA: Diagnosis not present

## 2018-02-28 DIAGNOSIS — M109 Gout, unspecified: Secondary | ICD-10-CM | POA: Diagnosis not present

## 2018-02-28 DIAGNOSIS — K589 Irritable bowel syndrome without diarrhea: Secondary | ICD-10-CM | POA: Insufficient documentation

## 2018-02-28 DIAGNOSIS — Z9889 Other specified postprocedural states: Secondary | ICD-10-CM | POA: Insufficient documentation

## 2018-02-28 DIAGNOSIS — Z96653 Presence of artificial knee joint, bilateral: Secondary | ICD-10-CM | POA: Insufficient documentation

## 2018-02-28 DIAGNOSIS — Z91040 Latex allergy status: Secondary | ICD-10-CM | POA: Diagnosis not present

## 2018-02-28 DIAGNOSIS — Z881 Allergy status to other antibiotic agents status: Secondary | ICD-10-CM | POA: Diagnosis not present

## 2018-02-28 DIAGNOSIS — Z79899 Other long term (current) drug therapy: Secondary | ICD-10-CM | POA: Diagnosis not present

## 2018-02-28 DIAGNOSIS — I129 Hypertensive chronic kidney disease with stage 1 through stage 4 chronic kidney disease, or unspecified chronic kidney disease: Secondary | ICD-10-CM | POA: Insufficient documentation

## 2018-02-28 DIAGNOSIS — R011 Cardiac murmur, unspecified: Secondary | ICD-10-CM | POA: Insufficient documentation

## 2018-02-28 DIAGNOSIS — N132 Hydronephrosis with renal and ureteral calculous obstruction: Secondary | ICD-10-CM | POA: Diagnosis not present

## 2018-02-28 HISTORY — PX: NEPHROLITHOTOMY: SHX5134

## 2018-02-28 SURGERY — NEPHROLITHOTOMY PERCUTANEOUS
Anesthesia: General | Laterality: Right | Wound class: Clean Contaminated

## 2018-02-28 MED ORDER — MIDAZOLAM HCL 2 MG/2ML IJ SOLN
INTRAMUSCULAR | Status: DC | PRN
  Administered 2018-02-28: 2 mg via INTRAVENOUS

## 2018-02-28 MED ORDER — PROPOFOL 10 MG/ML IV BOLUS
INTRAVENOUS | Status: DC | PRN
  Administered 2018-02-28: 160 mg via INTRAVENOUS

## 2018-02-28 MED ORDER — FAMOTIDINE 20 MG PO TABS
ORAL_TABLET | ORAL | Status: AC
  Administered 2018-02-28: 20 mg via ORAL
  Filled 2018-02-28: qty 1

## 2018-02-28 MED ORDER — HEPARIN SODIUM (PORCINE) 5000 UNIT/ML IJ SOLN
5000.0000 [IU] | Freq: Three times a day (TID) | INTRAMUSCULAR | Status: DC
  Administered 2018-02-28 – 2018-03-01 (×2): 5000 [IU] via SUBCUTANEOUS
  Filled 2018-02-28 (×2): qty 1

## 2018-02-28 MED ORDER — LORATADINE 10 MG PO TABS
10.0000 mg | ORAL_TABLET | Freq: Every day | ORAL | Status: DC
  Administered 2018-03-01: 10 mg via ORAL
  Filled 2018-02-28: qty 1

## 2018-02-28 MED ORDER — LIDOCAINE HCL (CARDIAC) PF 100 MG/5ML IV SOSY
PREFILLED_SYRINGE | INTRAVENOUS | Status: DC | PRN
  Administered 2018-02-28: 100 mg via INTRAVENOUS

## 2018-02-28 MED ORDER — METOPROLOL TARTRATE 50 MG PO TABS
50.0000 mg | ORAL_TABLET | Freq: Two times a day (BID) | ORAL | Status: DC
  Administered 2018-02-28 – 2018-03-01 (×2): 50 mg via ORAL
  Filled 2018-02-28 (×2): qty 1

## 2018-02-28 MED ORDER — MORPHINE SULFATE (PF) 2 MG/ML IV SOLN: 2.0000 mg | INTRAVENOUS | Status: DC | PRN

## 2018-02-28 MED ORDER — DIPHENHYDRAMINE HCL 50 MG/ML IJ SOLN: 12.5000 mg | Freq: Four times a day (QID) | INTRAMUSCULAR | Status: DC | PRN

## 2018-02-28 MED ORDER — ACETAMINOPHEN 10 MG/ML IV SOLN
INTRAVENOUS | Status: AC
  Filled 2018-02-28: qty 100

## 2018-02-28 MED ORDER — FENTANYL CITRATE (PF) 100 MCG/2ML IJ SOLN
INTRAMUSCULAR | Status: AC
  Administered 2018-02-28: 25 ug via INTRAVENOUS
  Filled 2018-02-28: qty 2

## 2018-02-28 MED ORDER — FENTANYL CITRATE (PF) 100 MCG/2ML IJ SOLN
25.0000 ug | INTRAMUSCULAR | Status: DC | PRN
  Administered 2018-02-28 (×2): 25 ug via INTRAVENOUS

## 2018-02-28 MED ORDER — ONDANSETRON HCL 4 MG/2ML IJ SOLN: 4.0000 mg | Freq: Once | INTRAMUSCULAR | Status: DC | PRN

## 2018-02-28 MED ORDER — FAMOTIDINE 20 MG PO TABS
20.0000 mg | ORAL_TABLET | Freq: Once | ORAL | Status: AC
  Administered 2018-02-28: 20 mg via ORAL

## 2018-02-28 MED ORDER — OXYBUTYNIN CHLORIDE 5 MG PO TABS: 5.0000 mg | ORAL_TABLET | Freq: Three times a day (TID) | ORAL | Status: DC | PRN

## 2018-02-28 MED ORDER — OXYCODONE-ACETAMINOPHEN 5-325 MG PO TABS
1.0000 | ORAL_TABLET | ORAL | Status: DC | PRN
  Administered 2018-03-01: 2 via ORAL
  Filled 2018-02-28: qty 2

## 2018-02-28 MED ORDER — SUGAMMADEX SODIUM 200 MG/2ML IV SOLN
INTRAVENOUS | Status: DC | PRN
  Administered 2018-02-28: 100 mg via INTRAVENOUS

## 2018-02-28 MED ORDER — CEFAZOLIN SODIUM-DEXTROSE 1-4 GM/50ML-% IV SOLN
1.0000 g | Freq: Three times a day (TID) | INTRAVENOUS | Status: AC
  Administered 2018-02-28 – 2018-03-01 (×2): 1 g via INTRAVENOUS
  Filled 2018-02-28 (×2): qty 50

## 2018-02-28 MED ORDER — FENTANYL CITRATE (PF) 250 MCG/5ML IJ SOLN
INTRAMUSCULAR | Status: AC
  Filled 2018-02-28: qty 5

## 2018-02-28 MED ORDER — AMIODARONE HCL 200 MG PO TABS
200.0000 mg | ORAL_TABLET | Freq: Every day | ORAL | Status: DC
  Administered 2018-03-01: 200 mg via ORAL
  Filled 2018-02-28: qty 1

## 2018-02-28 MED ORDER — LACTATED RINGERS IV SOLN
INTRAVENOUS | Status: DC
  Administered 2018-02-28: 10:00:00 via INTRAVENOUS

## 2018-02-28 MED ORDER — METOPROLOL TARTRATE 50 MG PO TABS
100.0000 mg | ORAL_TABLET | Freq: Two times a day (BID) | ORAL | Status: DC
  Filled 2018-02-28: qty 2

## 2018-02-28 MED ORDER — ACETAMINOPHEN 325 MG PO TABS: 650.0000 mg | ORAL_TABLET | ORAL | Status: DC | PRN

## 2018-02-28 MED ORDER — PROPOFOL 10 MG/ML IV BOLUS
INTRAVENOUS | Status: AC
  Filled 2018-02-28: qty 20

## 2018-02-28 MED ORDER — ONDANSETRON HCL 4 MG/2ML IJ SOLN: 4.0000 mg | INTRAMUSCULAR | Status: DC | PRN

## 2018-02-28 MED ORDER — DIPHENHYDRAMINE HCL 12.5 MG/5ML PO ELIX
12.5000 mg | ORAL_SOLUTION | Freq: Four times a day (QID) | ORAL | Status: DC | PRN
  Filled 2018-02-28: qty 5

## 2018-02-28 MED ORDER — MIDAZOLAM HCL 2 MG/2ML IJ SOLN
INTRAMUSCULAR | Status: AC
  Filled 2018-02-28: qty 2

## 2018-02-28 MED ORDER — FENTANYL CITRATE (PF) 100 MCG/2ML IJ SOLN
INTRAMUSCULAR | Status: DC | PRN
  Administered 2018-02-28 (×5): 50 ug via INTRAVENOUS

## 2018-02-28 MED ORDER — CEFAZOLIN SODIUM-DEXTROSE 2-4 GM/100ML-% IV SOLN
INTRAVENOUS | Status: AC
  Filled 2018-02-28: qty 100

## 2018-02-28 MED ORDER — IOTHALAMATE MEGLUMINE 43 % IV SOLN
INTRAVENOUS | Status: DC | PRN
  Administered 2018-02-28: 30 mL via SURGICAL_CAVITY

## 2018-02-28 MED ORDER — ROCURONIUM BROMIDE 100 MG/10ML IV SOLN
INTRAVENOUS | Status: DC | PRN
  Administered 2018-02-28: 50 mg via INTRAVENOUS

## 2018-02-28 MED ORDER — ACETAMINOPHEN 10 MG/ML IV SOLN
INTRAVENOUS | Status: DC | PRN
  Administered 2018-02-28: 1000 mg via INTRAVENOUS

## 2018-02-28 MED ORDER — BELLADONNA ALKALOIDS-OPIUM 16.2-60 MG RE SUPP: 1.0000 | Freq: Four times a day (QID) | RECTAL | Status: DC | PRN

## 2018-02-28 MED ORDER — DEXAMETHASONE SODIUM PHOSPHATE 10 MG/ML IJ SOLN
INTRAMUSCULAR | Status: DC | PRN
  Administered 2018-02-28: 5 mg via INTRAVENOUS

## 2018-02-28 MED ORDER — SODIUM CHLORIDE 0.9 % IV SOLN
INTRAVENOUS | Status: DC
  Administered 2018-02-28 – 2018-03-01 (×2): via INTRAVENOUS

## 2018-02-28 MED ORDER — LACTATED RINGERS IV SOLN
INTRAVENOUS | Status: DC | PRN
  Administered 2018-02-28: 11:00:00 via INTRAVENOUS

## 2018-02-28 MED ORDER — DOCUSATE SODIUM 100 MG PO CAPS
100.0000 mg | ORAL_CAPSULE | Freq: Two times a day (BID) | ORAL | Status: DC
  Administered 2018-02-28: 100 mg via ORAL
  Filled 2018-02-28 (×2): qty 1

## 2018-02-28 MED ORDER — ONDANSETRON HCL 4 MG/2ML IJ SOLN
INTRAMUSCULAR | Status: DC | PRN
  Administered 2018-02-28: 4 mg via INTRAVENOUS

## 2018-02-28 SURGICAL SUPPLY — 69 items
ADAPTER IRRIG TUBE 2 SPIKE SOL (ADAPTER) ×6 IMPLANT
ADAPTER SCOPE UROLOK II (MISCELLANEOUS) ×3 IMPLANT
BAG URINE DRAINAGE (UROLOGICAL SUPPLIES) ×3 IMPLANT
BALLN NEPHROSTOMY 10MMX15CM (UROLOGICAL SUPPLIES) ×1
BALLN NEPHROSTOMY 10X15 (UROLOGICAL SUPPLIES) ×2 IMPLANT
BASKET ZERO TIP 1.9FR (BASKET) ×3 IMPLANT
BLADE SURG 15 STRL LF DISP TIS (BLADE) ×1 IMPLANT
BLADE SURG 15 STRL SS (BLADE) ×2
CATH COUNCIL 22FR (CATHETERS) IMPLANT
CATH FOLEY 2W COUNCIL 20FR 5CC (CATHETERS) ×3 IMPLANT
CATH FOLEY 2W COUNCIL 5CC 18FR (CATHETERS) IMPLANT
CATH STENT KAYE NEPHR TAMP (CATHETERS) IMPLANT
CATH URETL 5X70 OPEN END (CATHETERS) ×3 IMPLANT
CATH/STENT KAYE NEPHR TAMP (CATHETERS)
CHLORAPREP W/TINT 26ML (MISCELLANEOUS) ×3 IMPLANT
CNTNR SPEC 2.5X3XGRAD LEK (MISCELLANEOUS) ×1
CONRAY 43 FOR UROLOGY 50M (MISCELLANEOUS) ×9 IMPLANT
CONT SPEC 4OZ STER OR WHT (MISCELLANEOUS) ×2
CONTAINER SPEC 2.5X3XGRAD LEK (MISCELLANEOUS) ×1 IMPLANT
DRAPE C-ARM XRAY 36X54 (DRAPES) ×3 IMPLANT
DRAPE SHEET LG 3/4 BI-LAMINATE (DRAPES) ×3 IMPLANT
DRAPE SURG 17X11 SM STRL (DRAPES) ×12 IMPLANT
GAUZE SPONGE 4X4 12PLY STRL (GAUZE/BANDAGES/DRESSINGS) ×3 IMPLANT
GLIDEWIRE STIFF .35X180X3 HYDR (WIRE) ×3 IMPLANT
GLOVE BIO SURGEON STRL SZ 6.5 (GLOVE) ×2 IMPLANT
GLOVE BIO SURGEONS STRL SZ 6.5 (GLOVE) ×1
GLOVE BIOGEL PI IND STRL 6.5 (GLOVE) ×2 IMPLANT
GLOVE BIOGEL PI INDICATOR 6.5 (GLOVE) ×4
GLOVE SKINSENSE NS SZ6.5 (GLOVE) ×6
GLOVE SKINSENSE STRL SZ6.5 (GLOVE) ×3 IMPLANT
GOWN STRL REUS W/ TWL LRG LVL3 (GOWN DISPOSABLE) ×2 IMPLANT
GOWN STRL REUS W/TWL LRG LVL3 (GOWN DISPOSABLE) ×4
GUIDEWIRE GREEN .038 145CM (MISCELLANEOUS) IMPLANT
GUIDEWIRE INTRO SET STRAIGHT (WIRE) ×3 IMPLANT
GUIDEWIRE STR ZIPWIRE 035X150 (MISCELLANEOUS) IMPLANT
GUIDEWIRE SUPER STIFF (WIRE) ×3 IMPLANT
HOLDER FOLEY CATH W/STRAP (MISCELLANEOUS) ×3 IMPLANT
INTRODUCER DILATOR DOUBLE (INTRODUCER) ×3 IMPLANT
MANIFOLD NEPTUNE II (INSTRUMENTS) ×3 IMPLANT
MAT ABSORB  FLUID 56X50 GRAY (MISCELLANEOUS) ×4
MAT ABSORB FLUID 56X50 GRAY (MISCELLANEOUS) ×2 IMPLANT
NDL FASCIA INCISION 18GA (NEEDLE) ×3 IMPLANT
PACK BASIN MINOR ARMC (MISCELLANEOUS) ×3 IMPLANT
PAD ABD DERMACEA PRESS 5X9 (GAUZE/BANDAGES/DRESSINGS) ×6 IMPLANT
PAD PREP 24X41 OB/GYN DISP (PERSONAL CARE ITEMS) ×3 IMPLANT
PROBE CYBERWAND SET (MISCELLANEOUS) ×3 IMPLANT
SENSORWIRE 0.038 NOT ANGLED (WIRE) ×3
SET IRRIGATING DISP (SET/KITS/TRAYS/PACK) ×3 IMPLANT
SHEET NEURO XL SOL CTL (MISCELLANEOUS) ×3 IMPLANT
SOL .9 NS 3000ML IRR  AL (IV SOLUTION) ×8
SOL .9 NS 3000ML IRR UROMATIC (IV SOLUTION) ×4 IMPLANT
SPONGE DRAIN TRACH 4X4 STRL 2S (GAUZE/BANDAGES/DRESSINGS) ×3 IMPLANT
STENT URET 6FRX24 CONTOUR (STENTS) IMPLANT
STENT URET 6FRX26 CONTOUR (STENTS) IMPLANT
STRAP SAFETY 5IN WIDE (MISCELLANEOUS) ×6 IMPLANT
SUT SILK 0 SH 30 (SUTURE) ×3 IMPLANT
SYR 10ML LL (SYRINGE) ×3 IMPLANT
SYR 20CC LL (SYRINGE) ×3 IMPLANT
SYR 30ML LL (SYRINGE) ×3 IMPLANT
SYRINGE IRR TOOMEY STRL 70CC (SYRINGE) ×3 IMPLANT
TAPE CLOTH 10X20 WHT NS LF (TAPE) ×1 IMPLANT
TAPE CLOTH 2X10 WHT NS LF (TAPE) ×2
TAPE MICROFOAM 4IN (TAPE) ×3 IMPLANT
TRAP SPECIMEN MUCOUS 40CC (MISCELLANEOUS) IMPLANT
TRAY FOLEY MTR SLVR 16FR STAT (SET/KITS/TRAYS/PACK) ×3 IMPLANT
TUBING CONNECTING 10 (TUBING) ×2 IMPLANT
TUBING CONNECTING 10' (TUBING) ×1
WATER STERILE IRR 1000ML POUR (IV SOLUTION) ×3 IMPLANT
WIRE SENSOR 0.038 NOT ANGLED (WIRE) ×1 IMPLANT

## 2018-02-28 NOTE — Op Note (Signed)
Date of procedure: 02/28/18  Preoperative diagnosis:  1. 2.4 cm right renal pelvic stone 2. History of sepsis of urinary source 3. Right hydronephrosis  Postoperative diagnosis:  1. Same as above  Procedure: 1. Right percutaneous nephrolithotomy 2. Right antegrade ureteroscopy 3. Right antegrade nephrostogram 4. Right nephrostomy tube placement 5. Interpretation of fluoroscopy less than 30 minutes  Surgeon: Hollice Espy, MD  Anesthesia: General  Complications: None  Intraoperative findings: Large stone encountered within the renal pelvis along with several smaller stones in the right lower pole.  Small fragments removed from right ureter.  Stone free at the end of procedure.  EBL: 100 cc  Specimens: Stone fragment  Drains: 16 French Foley catheter per urethra, 78 [place] council tip Foley catheter as right nephrostomy tube, 5 [place] open-ended nephroureteral catheter  Indication: Emily Velazquez is a 67 y.o. patient with 2.4 cm partially obstructing right renal pelvic stone to initially presented with sepsis of urinary source requiring urgent percutaneous nephrostomy tube placement.  This was exchanged for a nephroureteral catheter yesterday..  After reviewing the management options for treatment, she elected to proceed with the above surgical procedure(s). We have discussed the potential benefits and risks of the procedure, side effects of the proposed treatment, the likelihood of the patient achieving the goals of the procedure, and any potential problems that might occur during the procedure or recuperation. Informed consent has been obtained.  Description of procedure:  The patient was taken to the operating room and general anesthesia was induced on the stretcher.  Foley catheter was placed per urethra using sterile sterile technique.  At this point time, she was repositioned on the operating room bed with care to pad all pressure points and carefully strapped to the table.   At this point time, timeout protocol was performed after she was prepped and draped in standard sterile fashion.  Perioperative antibiotics were administered.  At this point time, scout imaging revealed the large pelvic stone as well as a few smaller stones in the right lower pole.  The nephroureteral catheter was seen down to level of the bladder.  This was cannulated using a sensor wire down to level the bladder and the catheter was removed leaving the wire in place.  A safety wire introducer was then used to introduce a Super Stiff wire down to the bladder as well without difficulty.  A fascial incisor needle was used to incise the posterior fascia and the incision was widened to accommodate the sheath.  A NephroMax balloon was then advanced near to the level of the stone which was inflated to 18 cm of water left in place for several minutes to allow for hemostasis.  The sheath was then advanced over the balloon to the appropriate position.  The nephroscope was then assembled.  The balloon was then deflated and no significant active bleeding was appreciated.  The nephroscope was then advanced into the kidney where the stone was almost immediately encountered.  A cyber wand was then used to fragment the stone which is quite hard.  The majority of the fragments were aspirated using the cyber wand.  Once adequate fragmentation is taken place, a Parona forceps was used to remove the residual stone fragment in the lower pole as well as the level of the UPJ.  Next, flexible cystoscope was used and advanced down the ureter where additional stone fragments were identified and removed using a 1.9 [place] tipless nitinol basket.  The scope was able to be advanced all the way  down to the level of the iliacs to ensure that there is no significant residual ureteral stone present.  Contrast was then injected to the cystoscope and the ureter to ensure that there was no residual filling defects within the ureter.  The ureter  was widely patent and draining into the bladder without difficulty.  The scope was then carefully backed up the length of the ureter inspecting along the way.  No ureteral injuries or additional significant fragments were identified.  The remainder of the collecting system was also then carefully surveyed and no residual stone fragment was identified.  A final antegrade nephrostogram was performed to create a roadmap of the kidney to ensure that each every calyx was identified and directly visualized.  Once all stone fragment was satisfactorily removed both under direct visualization as well as endoscopically, a 71 [place] council tip catheter was advanced over the working wire into the renal pelvis.  The balloon was filled with about 2 cc of diluted contrast solution.  The sheath was then removed leaving the nephrostomy tube in place.  An antegrade nephrostogram was performed again without any significant extravasation outlining the collecting system without filling defects and traversing down the ureter.  Finally, a 5 [place] open-ended ureteral catheter was advanced over the safety wire down to the level of the distal ureter.  The safety wire was removed leaving the open-ended ureteral catheter in place.  This was tied off.  Each of these tubes were then secured to the patient's left flank using 0 silk x2.  Patient was then cleaned and dried and a dressing of 4 x 4's and ABD pad and foam tape was applied.  Patient was then repositioned in supine position on the stretcher, reversed from anesthesia, and taken to the PACU in stable condition.  There are no complications in this case.  Plan: Patient will be admitted overnight for observation.  We will clamp her nephrostomy tube in the morning and hopefully removal tubes before discharge.  Hollice Espy, M.D.

## 2018-02-28 NOTE — Transfer of Care (Addendum)
Immediate Anesthesia Transfer of Care Note  Patient: Emily Velazquez  Procedure(s) Performed: NEPHROLITHOTOMY PERCUTANEOUS (Right )  Patient Location: PACU  Anesthesia Type:General  Level of Consciousness: awake  Airway & Oxygen Therapy: Patient Spontanous Breathing  Post-op Assessment: Report given to RN  Post vital signs: stable  Last Vitals:  Vitals Value Taken Time  BP    Temp    Pulse 59 02/28/2018  1:32 PM  Resp    SpO2 94 % 02/28/2018  1:32 PM  Vitals shown include unvalidated device data.  Last Pain:  Vitals:   02/28/18 0946  TempSrc: Temporal  PainSc: 4          Complications: No apparent anesthesia complications

## 2018-02-28 NOTE — Anesthesia Post-op Follow-up Note (Signed)
Anesthesia QCDR form completed.        

## 2018-02-28 NOTE — Progress Notes (Signed)
Had conversation with pharmacist regarding Lopressor; patient states that she only takes 50 mg twice a day, that her [facility] Doctor changed her prescription; Dr. Erlene Quan called and order changed to as she takes at home. Barbaraann Faster, RN 02/28/2018

## 2018-02-28 NOTE — Anesthesia Preprocedure Evaluation (Signed)
Anesthesia Evaluation  Patient identified by MRN, date of birth, ID band Patient awake    Reviewed: Allergy & Precautions, NPO status , Patient's Chart, lab work & pertinent test results  History of Anesthesia Complications Negative for: history of anesthetic complications  Airway Mallampati: II       Dental   Pulmonary neg sleep apnea, neg COPD,           Cardiovascular hypertension, Pt. on medications (-) Past MI and (-) CHF + dysrhythmias (s/p ablation resolved) Atrial Fibrillation + Valvular Problems/Murmurs (mild murmur, no tx)      Neuro/Psych neg Seizures    GI/Hepatic Neg liver ROS, neg GERD  ,  Endo/Other  neg diabetes  Renal/GU Renal disease (stones)     Musculoskeletal   Abdominal   Peds  Hematology   Anesthesia Other Findings   Reproductive/Obstetrics                             Anesthesia Physical Anesthesia Plan  ASA: III  Anesthesia Plan: General   Post-op Pain Management:    Induction: Intravenous  PONV Risk Score and Plan: 3 and Dexamethasone, Ondansetron and Midazolam  Airway Management Planned: Oral ETT  Additional Equipment:   Intra-op Plan:   Post-operative Plan:   Informed Consent: I have reviewed the patients History and Physical, chart, labs and discussed the procedure including the risks, benefits and alternatives for the proposed anesthesia with the patient or authorized representative who has indicated his/her understanding and acceptance.     Plan Discussed with:   Anesthesia Plan Comments:         Anesthesia Quick Evaluation

## 2018-02-28 NOTE — Anesthesia Postprocedure Evaluation (Signed)
Anesthesia Post Note  Patient: Emily Velazquez  Procedure(s) Performed: NEPHROLITHOTOMY PERCUTANEOUS (Right )  Patient location during evaluation: PACU Anesthesia Type: General Level of consciousness: awake and alert Pain management: pain level controlled Vital Signs Assessment: post-procedure vital signs reviewed and stable Respiratory status: spontaneous breathing and respiratory function stable Cardiovascular status: stable Anesthetic complications: no     Last Vitals:  Vitals:   02/28/18 1404 02/28/18 1405  BP: (!) 157/63   Pulse: (!) 58 (!) 56  Resp: 17 17  Temp:    SpO2: 99% 99%    Last Pain:  Vitals:   02/28/18 1405  TempSrc:   PainSc: 7                  Levar Fayson K

## 2018-02-28 NOTE — Anesthesia Procedure Notes (Signed)
Procedure Name: Intubation Performed by: Marcy Siren, CRNA Pre-anesthesia Checklist: Patient identified, Emergency Drugs available, Suction available and Patient being monitored Patient Re-evaluated:Patient Re-evaluated prior to induction Induction Type: IV induction Laryngoscope Size: Mac and 3 Grade View: Grade II Tube type: Oral Tube size: 7.0 mm Number of attempts: 1 Placement Confirmation: ETT inserted through vocal cords under direct vision,  positive ETCO2 and breath sounds checked- equal and bilateral Secured at: 21 cm Tube secured with: Tape Dental Injury: Teeth and Oropharynx as per pre-operative assessment

## 2018-02-28 NOTE — H&P (Signed)
01/23/2018 --> updated 02/28/2018 S/p conversion on PCN to nephroureteral catheter yesterday preop UCx with mixed flora, treated with Keflex as precaution RRR CTAB  AMATULLAH Emily Velazquez [id number]  Referring provider: Tracie Harrier, [facility] [place], [place] [address]      Chief Complaint  Patient presents with  . Nephrolithiasis    New patient    HPI: 67 year old female who presents today for follow-up of a large right renal stone.  [name] was previously followed by Dr. [name] for large right kidney stone.  She was considering surgical intervention after she underwent cardiac ablation for treatment of her A. fib.  Unfortunately, she presented to the emergency room on 01/17/2018 in florid sepsis from urinary source.  Blood and urine cultures grew E. coli.  CT scan showed a 2.4 cm right renal pelvic stone encroaching on the UPJ with moderate perinephric stranding and hydronephrosis.  She underwent emergent right percutaneous nephrostomy tube placement in the upper pole calyx.  She ultimately defervesced and was discharged home on oral antibiotics.  She reports that she continues to feel fatigued but is no longer having fevers or chills.  She is having intermittent hematuria from her nephrostomy tube with is clear today.  She is back on her Eliquis.  She does have baseline urinary symptoms and was also followed by Dr. [name] for stress urinary incontinence and urinary urgency.  PMH:     Past Medical History:  Diagnosis Date  . Back spasm    VOCAL CORD  . Edema   . Gout   . Heart murmur   . History of kidney stones   . Hypertension   . IBS (irritable bowel syndrome)   . OAB (overactive bladder)   . PAF (paroxysmal atrial fibrillation) ([place])   . Rosacea     Surgical History:      Past Surgical History:  Procedure Laterality Date  . CARDIOVERSION N/A 05/23/2017   Procedure: CARDIOVERSION;  Surgeon:  [name], MD;  Location: [facility] ORS;  Service: Cardiovascular;  Laterality: N/A;  . CARDIOVERSION N/A 06/22/2017   Procedure: CARDIOVERSION;  Surgeon: [name], MD;  Location: [facility] ORS;  Service: Cardiovascular;  Laterality: N/A;  . CATARACT EXTRACTION W/PHACO Right 10/26/2016   Procedure: CATARACT EXTRACTION PHACO AND INTRAOCULAR LENS PLACEMENT ([place]) suture placed in right eye at end of procedure;  Surgeon: [name], MD;  Location: [facility] ORS;  Service: Ophthalmology;  Laterality: Right;  [place]  01:21 AP% 23.7 CDE 36.24 Fluid pack lot # [id number]  . COLON SURGERY     RESECTION  . IR NEPHROSTOMY PLACEMENT RIGHT  01/17/2018  . JOINT REPLACEMENT     BIL TKR  . REPLACEMENT TOTAL KNEE BILATERAL    . TEE WITHOUT CARDIOVERSION N/A 08/24/2017   Procedure: TRANSESOPHAGEAL ECHOCARDIOGRAM (TEE);  Surgeon: [name], MD;  Location: [facility] ORS;  Service: Cardiovascular;  Laterality: N/A;  . VAGINAL HYSTERECTOMY  1997   complete    Home Medications:       Allergies as of 01/23/2018      Reactions   Ace Inhibitors Itching   Codeine Sulfate Itching       Latex Itching, Other (See Comments)   Gloves    Levofloxacin Other (See Comments)   Gastritis.  Onset 08/03/1999.   Shellfish-derived Products Other (See Comments)   Unknown, showed up on allergy test   Sulfa Antibiotics Itching  Medication List            Accurate as of 01/23/18  2:56 PM. Always use your most recent med list.           acetaminophen 500 MG tablet Commonly known as:  TYLENOL Take 1,000 mg by mouth 2 (two) times daily as needed for mild pain.   amiodarone 200 MG tablet Commonly known as:  PACERONE Take 200 mg by mouth daily.   amLODipine 5 MG tablet Commonly known as:  NORVASC Take 5 mg daily by mouth.   cefUROXime 250 MG tablet Commonly known as:  CEFTIN Take 1 tablet (250 mg total) by mouth 2 (two) times daily with a meal for 7 days.    ELIQUIS 5 MG Tabs tablet Generic drug:  apixaban Take 5 mg 2 (two) times daily by mouth.   estradiol 0.5 MG tablet Commonly known as:  ESTRACE Take 0.5 mg by mouth daily.   metoprolol tartrate 100 MG tablet Commonly known as:  LOPRESSOR Take 1 tablet (100 mg total) by mouth 2 (two) times daily. One half tablet twice per day       Allergies:       Allergies  Allergen Reactions  . Ace Inhibitors Itching  . Codeine Sulfate Itching        . Latex Itching and Other (See Comments)    Gloves   . Levofloxacin Other (See Comments)    Gastritis.  Onset 08/03/1999.  [place] Kitchen Shellfish-Derived Products Other (See Comments)    Unknown, showed up on allergy test  . Sulfa Antibiotics Itching    Family History:      Family History  Problem Relation Age of Onset  . Breast cancer Daughter 32    Social History:  reports that she has never smoked. She has never used smokeless tobacco. She reports that she does not drink alcohol or use drugs.  ROS: UROLOGY Frequent Urination?: No Hard to postpone urination?: No Burning/pain with urination?: No Get up at night to urinate?: No Leakage of urine?: No Urine stream starts and stops?: No Trouble starting stream?: No Do you have to strain to urinate?: No Blood in urine?: No Urinary tract infection?: No Sexually transmitted disease?: No Injury to kidneys or bladder?: No Painful intercourse?: No Weak stream?: No Currently pregnant?: No Vaginal bleeding?: No Last menstrual period?: n  Gastrointestinal Nausea?: No Vomiting?: No Indigestion/heartburn?: No Diarrhea?: No Constipation?: No  Constitutional Fever: No Night sweats?: No Weight loss?: No Fatigue?: No  Skin Skin rash/lesions?: No Itching?: No  Eyes Blurred vision?: No Double vision?: No  Ears/Nose/Throat Sore throat?: No Sinus problems?: No  Hematologic/Lymphatic Swollen glands?: No Easy bruising?: No  Cardiovascular Leg swelling?:  No Chest pain?: No  Respiratory Cough?: No Shortness of breath?: No  Endocrine Excessive thirst?: No  Musculoskeletal Back pain?: No Joint pain?: No  Neurological Headaches?: No Dizziness?: No  Psychologic Depression?: No Anxiety?: No  Physical Exam: BP 131/69   Pulse 64   Ht 5\' 3"  (1.6 m)   Wt 210 lb (95.3 kg)   BMI 37.20 kg/m   Constitutional:  Alert and oriented, No acute distress.  Accompanied by husband today. HEENT: [place] AT, moist mucus membranes.  Trachea midline, no masses. Cardiovascular: No clubbing, cyanosis, or edema. Respiratory: Normal respiratory effort, no increased work of breathing. GI: Abdomen is soft, nontender, nondistended, no abdominal masses GU: Right nephrostomy tube draining clear yellow urine.  Dressing clean dry and intact. Skin: No rashes, bruises or suspicious lesions. Neurologic: Grossly  intact, no focal deficits, moving all 4 extremities. Psychiatric: Normal mood and affect.  Laboratory Data: RecentLabs       Lab Results  Component Value Date   WBC 8.3 01/20/2018   HGB 10.3 (L) 01/20/2018   HCT 29.4 (L) 01/20/2018   MCV 91.8 01/20/2018   PLT 170 01/20/2018      RecentLabs       Lab Results  Component Value Date   CREATININE 1.40 (H) 01/20/2018      Urinalysis n/a  Pertinent Imaging: CLINICAL DATA: Abdominal pain with nausea and vomiting and diarrhea  EXAM: CT ABDOMEN AND PELVIS WITHOUT CONTRAST  TECHNIQUE: Multidetector CT imaging of the abdomen and pelvis was performed following the standard protocol without IV contrast.  COMPARISON: Radiograph 07/02/2013, CT 10/27/2010  FINDINGS: Lower chest: Lung bases demonstrate atelectasis at the left base. No pleural effusion. Borderline cardiomegaly. Small distal esophageal hiatal hernia.  Hepatobiliary: No focal liver abnormality is seen. No gallstones, gallbladder wall thickening, or biliary dilatation.  Pancreas: Unremarkable. No  pancreatic ductal dilatation or surrounding inflammatory changes.  Spleen: Normal in size without focal abnormality.  Adrenals/Urinary Tract: Adrenal glands are within normal limits. Enlarged right kidney with moderate to marked right perinephric fat stranding. Multiple calcified stones within the right kidney including a 2.3 cm stone in the right renal pelvis, causing mild right hydronephrosis. Small foci of air in the bladder.  Stomach/Bowel: Stomach nonenlarged. Postsurgical changes of the transverse colon. No bowel wall thickening.  Vascular/Lymphatic: Nonaneurysmal aorta. Small retroperitoneal lymph nodes measuring up to 8 mm.  Reproductive: Status post hysterectomy. No adnexal masses.  Other: Negative for free air. Trace fluid in the pelvis. Edema and soft tissue stranding in the right retroperitoneum.  Musculoskeletal: No acute or suspicious abnormality. Probable bone island at T11.  IMPRESSION: 1. Moderate right perinephric fat stranding with mild right hydronephrosis. This appears to be secondary to a 2.4 cm stone within the right renal pelvis, encroaching the right UPJ. There are multiple additional stones present within the right kidney. 2. Small foci of air within the bladder, this may be due to recent manipulation.   Electronically Signed By: Donavan Foil M.D. On: 01/17/2018 19:00  CT scan was reviewed again personally today and with the patient and her husband.  IR nephrostomy tube placement imaging was also reviewed and it appears that the nephrostomy tube was placed in the right upper pole.  Assessment & Plan:    1. Right kidney stone Large 2.4 cm right renal stone which is partially obstructing status post emergent right percutaneous nephrostomy tube  We discussed given the size of the stone, strongly recommend right PCNL to remove the stone.  This is likely the nidus of her infection.  We discussed the procedure at length today  including the preoperative, intraoperative, postoperative course.  All of her questions were answered.  We discussed the risk of surgery including risk of bleeding which could be severe and require blood transfusion, damage to surrounding structures, infection, need for multiple procedures and subsequent intervention, amongst others.  All questions were answered.   I will have her go to interventional radiology for second lower pole access to optimize access of the stone and minimize the number of procedures.  2. Pyelonephritis Growing E. coli in both urine and blood Given her bacteremia, will extend antibiotics for an additional week, changed to [place] to repeat voided urine culture prior to surgery to ensure the infection is cleared  Schedule right PCNL  Hollice Espy, MD  [facility] [address], [place], [place] [address] [phone]

## 2018-03-01 ENCOUNTER — Telehealth: Payer: Self-pay | Admitting: Urology

## 2018-03-01 ENCOUNTER — Encounter: Payer: Self-pay | Admitting: Urology

## 2018-03-01 DIAGNOSIS — N132 Hydronephrosis with renal and ureteral calculous obstruction: Secondary | ICD-10-CM | POA: Diagnosis not present

## 2018-03-01 DIAGNOSIS — N200 Calculus of kidney: Secondary | ICD-10-CM

## 2018-03-01 LAB — BASIC METABOLIC PANEL
Anion gap: 9 (ref 5–15)
BUN: 13 mg/dL (ref 8–23)
CALCIUM: 8.4 mg/dL — AB (ref 8.9–10.3)
CO2: 24 mmol/L (ref 22–32)
Chloride: 107 mmol/L (ref 98–111)
Creatinine, Ser: 1.35 mg/dL — ABNORMAL HIGH (ref 0.44–1.00)
GFR calc Af Amer: 46 mL/min — ABNORMAL LOW (ref >60)
GFR, EST NON AFRICAN AMERICAN: 39 mL/min — AB (ref >60)
GLUCOSE: 135 mg/dL — AB (ref 70–99)
Potassium: 3.4 mmol/L — ABNORMAL LOW (ref 3.5–5.1)
Sodium: 140 mmol/L (ref 135–145)

## 2018-03-01 LAB — CBC
HEMATOCRIT: 34.3 % — AB (ref 35.0–47.0)
Hemoglobin: 12 g/dL (ref 12.0–16.0)
MCH: 32 pg (ref 26.0–34.0)
MCHC: 35 g/dL (ref 32.0–36.0)
MCV: 91.5 fL (ref 80.0–100.0)
PLATELETS: 232 10*3/uL (ref 150–440)
RBC: 3.75 MIL/uL — ABNORMAL LOW (ref 3.80–5.20)
RDW: 14.6 % — AB (ref 11.5–14.5)
WBC: 8.2 10*3/uL (ref 3.6–11.0)

## 2018-03-01 MED ORDER — DOCUSATE SODIUM 100 MG PO CAPS: 100.0000 mg | ORAL_CAPSULE | Freq: Two times a day (BID) | ORAL | 0 refills | Status: DC

## 2018-03-01 MED ORDER — OXYCODONE-ACETAMINOPHEN 5-325 MG PO TABS: 1.0000 | ORAL_TABLET | ORAL | 0 refills | Status: DC | PRN

## 2018-03-01 NOTE — Care Management Obs Status (Signed)
[facility] NOTIFICATION   Patient Details  Name: Emily Velazquez MRN: [id number] Date of Birth: 1951-02-04   Medicare Observation Status Notification Given:  No(admitted obs less thand 24 hours )    Beverly Sessions, RN 03/01/2018, 1:49 PM

## 2018-03-01 NOTE — Telephone Encounter (Signed)
Order sent to scheduling  MB

## 2018-03-01 NOTE — Discharge Summary (Signed)
Date of admission: 02/28/2018  Date of discharge: 03/01/2018  Admission diagnosis: Right kidney stone, right hydronephrosis, CKD stage III  Discharge diagnosis: same  Secondary diagnoses:  Patient Active Problem List   Diagnosis Date Noted  . Severe sepsis ([place]) 01/17/2018  . Right kidney stone 01/17/2018  . Acute pyelonephritis 01/17/2018  . HTN (hypertension) 01/17/2018  . PAF (paroxysmal atrial fibrillation) ([place]) 01/17/2018  . AKI (acute kidney injury) ([place]) 01/17/2018    History and Physical: For full details, please see admission history and physical. Briefly, Emily Velazquez is a 67 y.o. year old patient with large right kidney stone admitted following uncomplicated PCNL.  Marland Kitchen   Hospital Course: Patient tolerated the procedure well.  She was then transferred to the floor after an uneventful PACU stay.  Her hospital course was uncomplicated.  Her nephrostomy tube was clamped early on postop day 1 and removed approximately 4 hours later after no significant change in her pain.  On POD#1 she had met discharge criteria: was eating a regular diet, was up and ambulating independently,  pain was well controlled, was voiding without a catheter, and was ready to for discharge.  Physical Exam  Constitutional: She appears well-developed and well-nourished.  HENT:  Head: Normocephalic and atraumatic.  Cardiovascular: Normal rate.  Pulmonary/Chest: She is in respiratory distress.  Abdominal: Soft. She exhibits no distension.  Genitourinary:  Genitourinary Comments: Nephrostomy to site clean dry and intact draining light pink blood-tinged urine  Skin: Skin is warm and dry.  Psychiatric: She has a normal mood and affect.  Vitals reviewed.     Laboratory values:  Recent Labs    03/01/18 0419  WBC 8.2  HGB 12.0  HCT 34.3*   Recent Labs    03/01/18 0419  NA 140  K 3.4*  CL 107  CO2 24  GLUCOSE 135*  BUN 13  CREATININE 1.35*  CALCIUM 8.4*   No results for input(s): LABPT, INR  in the last 72 hours. No results for input(s): LABURIN in the last 72 hours. Results for orders placed or performed during the hospital encounter of 02/20/18  Urine culture     Status: Abnormal   Collection Time: 02/20/18 12:17 PM  Result Value Ref Range Status   Specimen Description   Final    URINE, CLEAN CATCH Performed at [facility], [address] [place], [place] [address]    Special Requests   Final    NONE Performed at [facility], [address] [place], [place] [address]    Culture MULTIPLE SPECIES PRESENT, SUGGEST RECOLLECTION (A)  Final   Report Status 02/21/2018 FINAL  Final    Disposition: Home  Discharge instruction: See attached  Discharge medications:  Allergies as of 03/01/2018      Reactions   Ace Inhibitors Itching   Codeine Sulfate Itching       Latex Itching, Other (See Comments)   Gloves    Levofloxacin Other (See Comments)   Gastritis.  Onset 08/03/1999.   Shellfish-derived Products Other (See Comments)   Unknown, showed up on allergy test   Sulfa Antibiotics Itching      Medication List    STOP taking these medications   cephALEXin 500 MG capsule Commonly known as:  KEFLEX     TAKE these medications   acetaminophen 500 MG tablet Commonly known as:  TYLENOL Take 500 mg by mouth every 8 (eight) hours as needed for mild pain.   amiodarone 200 MG tablet Commonly known as:  PACERONE Take 200 mg by mouth daily.   cetirizine 10 MG tablet Commonly known as:  ZYRTEC Take 10 mg by mouth daily.   docusate sodium 100 MG capsule Commonly known as:  COLACE Take 1 capsule (100 mg total) by mouth 2 (two) times daily.   ELIQUIS 5 MG Tabs tablet Generic drug:  apixaban Take 5 mg 2 (two) times daily by mouth.   estradiol 0.5 MG tablet Commonly known as:  ESTRACE Take 0.5 mg by mouth daily.   metoprolol tartrate 50 MG tablet Commonly known as:  LOPRESSOR Take 50 mg by mouth 2 (two) times daily.   metoprolol tartrate  100 MG tablet Commonly known as:  LOPRESSOR Take 1 tablet (100 mg total) by mouth 2 (two) times daily. One half tablet twice per day   oxyCODONE-acetaminophen 5-325 MG tablet Commonly known as:  PERCOCET/ROXICET Take 1-2 tablets by mouth every 4 (four) hours as needed for moderate pain or severe pain.       Followup:  Follow-up Information    Hollice Espy, MD.   Specialty:  Urology Why:  as scheduled, you will need a KUB on the day of the visit Contact information: [address] [place] [id number] [phone]

## 2018-03-01 NOTE — Discharge Instructions (Signed)
It is normal for your nephrostomy tube site to leak copiously at first and then decrease over the next several days.  Please change the dressing as needed.  You may shower if you have a watertight dressing in place.  No baths or swimming until the skin has completely healed.  Hollice Espy, MD

## 2018-03-01 NOTE — Telephone Encounter (Signed)
I would like this patient to have a renal ultrasound as well as a KUB just prior to her follow-up visit in 1 month.  Orders placed in this phone.  Please help arrange.  Her follow-up is already scheduled.  Hollice Espy, MD

## 2018-03-05 ENCOUNTER — Ambulatory Visit: Payer: Medicare Other

## 2018-03-07 LAB — STONE ANALYSIS
CA OXALATE, DIHYDRATE: 3 %
CA OXALATE, MONOHYDR.: 77 %
CA PHOS CRY STONE QL IR: 5 %
Stone Weight KSTONE: 482.7 mg
URIC ACID: 15 %

## 2018-03-26 ENCOUNTER — Ambulatory Visit
Admission: RE | Admit: 2018-03-26 | Discharge: 2018-03-26 | Disposition: A | Discharge status: Home / Self Care | Payer: Medicare Other | Source: Ambulatory Visit | Attending: Urology | Admitting: Urology

## 2018-03-26 DIAGNOSIS — N200 Calculus of kidney: Secondary | ICD-10-CM | POA: Diagnosis not present

## 2018-03-28 ENCOUNTER — Ambulatory Visit
Admission: RE | Admit: 2018-03-28 | Discharge: 2018-03-28 | Disposition: A | Discharge status: Home / Self Care | Payer: Medicare Other | Source: Ambulatory Visit | Attending: Urology | Admitting: Urology

## 2018-03-28 ENCOUNTER — Ambulatory Visit (INDEPENDENT_AMBULATORY_CARE_PROVIDER_SITE_OTHER): Payer: Medicare Other | Admitting: Urology

## 2018-03-28 ENCOUNTER — Encounter: Payer: Self-pay | Admitting: Urology

## 2018-03-28 VITALS — BP 172/114 | HR 56 | Ht 63.0 in | Wt 209.2 lb

## 2018-03-28 DIAGNOSIS — N200 Calculus of kidney: Secondary | ICD-10-CM

## 2018-03-28 DIAGNOSIS — N209 Urinary calculus, unspecified: Secondary | ICD-10-CM

## 2018-03-28 NOTE — Progress Notes (Signed)
03/28/2018 9:36 AM   Emily Velazquez 05/10/1951 [id number]  Referring provider: Tracie Harrier, MD [address] [facility] [place], [place] [address]  Chief Complaint  Patient presents with  . Post-op Follow-up    NEPHROLITHOTOMY PERCUTANEOUS     HPI: 67 year old female with a 2.4 cm right renal pelvic stone status post uncomplicated right PCNL on 02/28/2018.  She returns today for routine postop visit.  She is done extremely well after surgery.  She has no further flank pain.  Incision has healed well.  No urinary symptoms today.  Follow-up renal ultrasound shows no hydronephrosis bilaterally.  KUB does show persistent nonobstructing stones x 2, 10 mg 4 mm plan the large dominant stone is gone.  Stone analysis primarily calcium oxalate monohydrate 77% calcium oxalate dihydrate 2%, uric acid 15%, calcium phosphate 5%.  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  She was previously, Dr. [name].  She had several previous stone surgeries in the past in the form of shockwave lithotripsy which were not particularly effective.  She does have a personal history of gout.  She admits to not drinking enough water.   PMH: Past Medical History:  Diagnosis Date  . Back spasm    VOCAL CORD  . Edema   . Fatty liver    history  . Gout   . Heart murmur   . History of atrioventricular nodal ablation   . History of kidney stones   . Hypertension   . IBS (irritable bowel syndrome)   . Kidney stones   . OAB (overactive bladder)   . PAF (paroxysmal atrial fibrillation) ([place])   . Rosacea     Surgical History: Past Surgical History:  Procedure Laterality Date  . CARDIOVERSION N/A 05/23/2017   Procedure: CARDIOVERSION;  Surgeon: [name], MD;  Location: [facility] ORS;  Service: Cardiovascular;  Laterality: N/A;  . CARDIOVERSION N/A 06/22/2017   Procedure: CARDIOVERSION;  Surgeon: [name], MD;  Location: [facility] ORS;  Service: Cardiovascular;   Laterality: N/A;  . CATARACT EXTRACTION W/PHACO Right 10/26/2016   Procedure: CATARACT EXTRACTION PHACO AND INTRAOCULAR LENS PLACEMENT ([place]) suture placed in right eye at end of procedure;  Surgeon: [name], MD;  Location: [facility] ORS;  Service: Ophthalmology;  Laterality: Right;  [place]  01:21 AP% 23.7 CDE 36.24 Fluid pack lot # [id number]  . COLON SURGERY     RESECTION  . IR NEPHROSTOMY PLACEMENT RIGHT  01/17/2018  . IR NEPHROSTOMY PLACEMENT RIGHT  02/27/2018  . JOINT REPLACEMENT     BIL TKR  . NEPHROLITHOTOMY Right 02/28/2018   Procedure: NEPHROLITHOTOMY PERCUTANEOUS;  Surgeon: Hollice Espy, MD;  Location: [facility] ORS;  Service: Urology;  Laterality: Right;  . REPLACEMENT TOTAL KNEE BILATERAL    . TEE WITHOUT CARDIOVERSION N/A 08/24/2017   Procedure: TRANSESOPHAGEAL ECHOCARDIOGRAM (TEE);  Surgeon: [name], MD;  Location: [facility] ORS;  Service: Cardiovascular;  Laterality: N/A;  . VAGINAL HYSTERECTOMY  1997   complete    Home Medications:  Allergies as of 03/28/2018      Reactions   Ace Inhibitors Itching   Codeine Sulfate Itching       Latex Itching, Other (See Comments)   Gloves    Levofloxacin Other (See Comments)   Gastritis.  Onset 08/03/1999.   Shellfish-derived Products Other (See Comments)   Unknown, showed up on allergy test   Sulfa Antibiotics Itching      Medication List        Accurate as of  03/28/18 11:59 PM. Always use your most recent med list.          acetaminophen 500 MG tablet Commonly known as:  TYLENOL Take 500 mg by mouth every 8 (eight) hours as needed for mild pain.   amiodarone 200 MG tablet Commonly known as:  PACERONE Take 200 mg by mouth daily.   amLODipine 5 MG tablet Commonly known as:  NORVASC TAKE 1 TABLET(5 MG) BY MOUTH EVERY DAY   cetirizine 10 MG tablet Commonly known as:  ZYRTEC Take 10 mg by mouth daily.   docusate sodium 100 MG capsule Commonly known as:  COLACE Take 1 capsule (100 mg total) by mouth 2 (two) times  daily.   ELIQUIS 5 MG Tabs tablet Generic drug:  apixaban Take 5 mg 2 (two) times daily by mouth.   estradiol 0.5 MG tablet Commonly known as:  ESTRACE Take 0.5 mg by mouth daily.   metoprolol tartrate 50 MG tablet Commonly known as:  LOPRESSOR Take 50 mg by mouth 2 (two) times daily.   metoprolol tartrate 100 MG tablet Commonly known as:  LOPRESSOR Take 1 tablet (100 mg total) by mouth 2 (two) times daily. One half tablet twice per day       Allergies:  Allergies  Allergen Reactions  . Ace Inhibitors Itching  . Codeine Sulfate Itching        . Latex Itching and Other (See Comments)    Gloves   . Levofloxacin Other (See Comments)    Gastritis.  Onset 08/03/1999.  Marland Kitchen Shellfish-Derived Products Other (See Comments)    Unknown, showed up on allergy test  . Sulfa Antibiotics Itching    Family History: Family History  Problem Relation Age of Onset  . Breast cancer Daughter 68    Social History:  reports that she has never smoked. She has never used smokeless tobacco. She reports that she does not drink alcohol or use drugs.  ROS: UROLOGY Frequent Urination?: No Hard to postpone urination?: No Burning/pain with urination?: No Get up at night to urinate?: No Leakage of urine?: No Urine stream starts and stops?: No Trouble starting stream?: No Do you have to strain to urinate?: No Blood in urine?: No Urinary tract infection?: No Sexually transmitted disease?: No Injury to kidneys or bladder?: No Painful intercourse?: No Weak stream?: No Currently pregnant?: No Vaginal bleeding?: No Last menstrual period?: n  Gastrointestinal Nausea?: No Vomiting?: No Indigestion/heartburn?: No Diarrhea?: No Constipation?: No  Constitutional Fever: No Night sweats?: No Weight loss?: No Fatigue?: No  Skin Skin rash/lesions?: No Itching?: No  Eyes Blurred vision?: No Double vision?: No  Ears/Nose/Throat Sore throat?: No Sinus problems?:  No  Hematologic/Lymphatic Swollen glands?: No Easy bruising?: No  Cardiovascular Leg swelling?: No Chest pain?: No  Respiratory Cough?: No Shortness of breath?: No  Endocrine Excessive thirst?: No  Musculoskeletal Back pain?: No Joint pain?: No  Neurological Headaches?: No Dizziness?: No  Psychologic Depression?: No Anxiety?: No  Physical Exam: BP (!) 172/114   Pulse (!) 56   Ht 5\' 3"  (1.6 m)   Wt 209 lb 3.2 oz (94.9 kg)   BMI 37.06 kg/m   Constitutional:  Alert and oriented, No acute distress. HEENT: [place] AT, moist mucus membranes.  Trachea midline, no masses. Cardiovascular: No clubbing, cyanosis, or edema. Respiratory: Normal respiratory effort, no increased work of breathing. GI: Abdomen is soft, nontender, nondistended, no abdominal masses, obese. GU: No CVA tenderness.  Well-healed. Skin: No rashes, bruises or suspicious lesions. Neurologic: Grossly intact,  no focal deficits, moving all 4 extremities. Psychiatric: Normal mood and affect.  Laboratory Data: Lab Results  Component Value Date   WBC 8.2 03/01/2018   HGB 12.0 03/01/2018   HCT 34.3 (L) 03/01/2018   MCV 91.5 03/01/2018   PLT 232 03/01/2018    Lab Results  Component Value Date   CREATININE 1.35 (H) 03/01/2018   Urinalysis N/a  Pertinent Imaging: Results for orders placed during the hospital encounter of 02/28/18  DG Abd 1 View   Narrative CLINICAL DATA:  Kidney stone  EXAM: DG C-ARM 61-120 MIN; ABDOMEN - 1 VIEW; DG C-ARM 1-60 MIN-NO REPORT  COMPARISON:  02/27/2018, CT 01/17/2018  FINDINGS: Right-sided nephrostomy tube in place. Wires and catheter extends from the renal pelvis into the urinary bladder. Large right renal calculus is present.  IMPRESSION: Left renal calculus. Percutaneous nephrostomy with wires and catheter extending into the urinary bladder.   Electronically Signed   By: Franchot Gallo M.D.   On: 02/28/2018 14:28     Results for orders placed during  the hospital encounter of 03/26/18  US RENAL   Narrative CLINICAL DATA:  Follow-up examination for right kidney stone. Status post PCNL.  EXAM: RENAL / URINARY TRACT ULTRASOUND COMPLETE  COMPARISON:  Prior CT from 01/17/2018  FINDINGS: Right Kidney:  Length: 10.9 cm. Echogenicity within normal limits. No mass or hydronephrosis visualized. Multiple nonobstructing stones seen within the mid and lower pole of the right kidney, largest of which measures 6.3 mm.  Left Kidney:  Length: 11.0 cm. Echogenicity within normal limits. No mass or hydronephrosis visualized.  Bladder:  Appears normal for degree of bladder distention. Bilateral ureteral jets visualized.  IMPRESSION: 1. Nonobstructive right renal nephrolithiasis measuring up to 6 mm. No hydronephrosis. 2. Normal left kidney.   Electronically Signed   By: Jeannine Boga M.D.   On: 03/26/2018 22:56    KUB personally reviewed today.  Renal ultrasound was also personally reviewed.  Agree with interpretation..  Assessment & Plan:    1. Right kidney stone Residual stone in the right lower pole, 10 mm and 4 mm I suspect that these were in parallel calyx and not seen during the time of PCNL due to malrotation and access issues At this point time, I recommended observation for the stones, will consider ureteroscopy in 6 months after she had more time to heal/recover We will plan for KUB in 6 months sooner for any interval growth or change of the stones Stone composition reviewed plan We discussed general stone prevention techniques including drinking plenty water with goal of producing 2.5 L urine daily, increased citric acid intake, avoidance of high oxalate containing foods, and decreased salt intake.  Information about dietary recommendations given today. - DG Abd 1 View; Future  2. Uric acid stone in urine Uric acid possible nidus of the stones Personal history of gout We will check uric acid level  today Handout given about low purine diet - Uric acid   Return in about 6 months (around 09/27/2018) for KUB.   Hollice Espy, MD  [facility] [address], [place], [place] [address] [phone]

## 2018-03-28 NOTE — Patient Instructions (Signed)

## 2018-03-29 ENCOUNTER — Telehealth: Payer: Self-pay | Admitting: Family Medicine

## 2018-03-29 LAB — URIC ACID: URIC ACID: 6.5 mg/dL (ref 2.5–7.1)

## 2018-03-29 NOTE — Telephone Encounter (Signed)
Patient notified via VM

## 2018-03-29 NOTE — Telephone Encounter (Signed)
-----   Message from Hollice Espy, MD sent at 03/29/2018  8:26 AM EDT ----- Uric acid levels normal.    Hollice Espy, MD

## 2018-04-19 ENCOUNTER — Telehealth: Payer: Self-pay | Admitting: Family Medicine

## 2018-04-19 MED ORDER — CEPHALEXIN 500 MG PO CAPS: 500.0000 mg | ORAL_CAPSULE | Freq: Four times a day (QID) | ORAL | 0 refills | Status: DC

## 2018-04-19 NOTE — Telephone Encounter (Signed)
Emily Velazquez states she went to [facility] to have a routine check up and she has a UTI. They prescribed her an ABX that she cannot take, she feels more comfortable letting you treat her urinary issues. She wants to know if you will prescribe an ABX for her UTI?

## 2018-04-19 NOTE — Telephone Encounter (Signed)
Spoke to patient and she states that when she went to her PCP she was having bladder pain, she mentioned it to her PCP and he checked the urine. She is till having symptoms. RX was sent to pharmacy

## 2018-04-19 NOTE — Telephone Encounter (Signed)
Is the patient symptomatic?  If the urine was obtained during a routine checkup and she was not having symptoms, I strongly recommend not treating it.  If she is symptomatic, we can treat her with Keflex 500 mg 4 times daily for 3 days.  I cannot tell from the notes what was prescribed by the primary care physician.  Please find out what it was.    Hollice Espy, MD

## 2018-07-30 ENCOUNTER — Telehealth: Payer: Self-pay | Admitting: Urology

## 2018-08-01 NOTE — Telephone Encounter (Signed)
ERROR

## 2018-08-28 ENCOUNTER — Ambulatory Visit (INDEPENDENT_AMBULATORY_CARE_PROVIDER_SITE_OTHER): Payer: Medicare Other | Admitting: Family Medicine

## 2018-08-28 ENCOUNTER — Telehealth: Payer: Self-pay | Admitting: Urology

## 2018-08-28 VITALS — BP 132/76 | HR 73

## 2018-08-28 DIAGNOSIS — N200 Calculus of kidney: Secondary | ICD-10-CM

## 2018-08-28 LAB — MICROSCOPIC EXAMINATION: RBC MICROSCOPIC, UA: NONE SEEN /HPF (ref 0–2)

## 2018-08-28 LAB — URINALYSIS, COMPLETE
BILIRUBIN UA: NEGATIVE
Glucose, UA: NEGATIVE
NITRITE UA: NEGATIVE
PH UA: 5.5 (ref 5.0–7.5)
SPEC GRAV UA: 1.02 (ref 1.005–1.030)
Urobilinogen, Ur: 0.2 mg/dL (ref 0.2–1.0)

## 2018-08-28 MED ORDER — CEPHALEXIN 500 MG PO CAPS: 500.0000 mg | ORAL_CAPSULE | Freq: Four times a day (QID) | ORAL | 0 refills | Status: DC

## 2018-08-28 NOTE — Progress Notes (Signed)
Patient was seen in office today with complaints of urinary frequency and lower abdominal pain. A urine was collected for UA, UCX. Patient states her symptoms started 1 week ago. She has been on ABX in the last month for UTI. Patient states she has not had any Urological surgeries in the last month. Dr. Erlene Quan reviewed the UA and Keflex was sent to pharmacy. A KUB was ordered to review the Kidney stone.

## 2018-08-28 NOTE — Telephone Encounter (Signed)
Pt has appt w/[facility] 4/7.  She came by office today w/UTI symptoms, back pain, lower abdominal pain, and frequency.

## 2018-08-29 ENCOUNTER — Other Ambulatory Visit: Payer: Self-pay

## 2018-08-29 ENCOUNTER — Ambulatory Visit
Admission: RE | Admit: 2018-08-29 | Discharge: 2018-08-29 | Disposition: A | Discharge status: Home / Self Care | Payer: Medicare Other | Source: Ambulatory Visit | Attending: Urology | Admitting: Urology

## 2018-08-29 ENCOUNTER — Ambulatory Visit
Admission: RE | Admit: 2018-08-29 | Discharge: 2018-08-29 | Disposition: A | Discharge status: Home / Self Care | Payer: Medicare Other | Attending: Urology | Admitting: Urology

## 2018-08-29 DIAGNOSIS — N200 Calculus of kidney: Secondary | ICD-10-CM | POA: Insufficient documentation

## 2018-08-30 LAB — CULTURE, URINE COMPREHENSIVE

## 2018-08-31 ENCOUNTER — Telehealth: Payer: Self-pay

## 2018-08-31 NOTE — Telephone Encounter (Signed)
-----   Message from Hollice Espy, MD sent at 08/29/2018  3:27 PM EST ----- There are no obstructing renal calculi noted.  She does have some nonobstructing stones in the right lower pole which should not be causing her pain.  This is good news.  We are waiting for urine culture to return.  Hollice Espy, MD

## 2018-08-31 NOTE — Telephone Encounter (Signed)
Called pt no answer. Unable to leave detailed message as no DPR is on file. Left generic message for pt to return call to clinic

## 2018-09-03 ENCOUNTER — Telehealth: Payer: Self-pay

## 2018-09-03 ENCOUNTER — Telehealth: Payer: Self-pay | Admitting: Urology

## 2018-09-03 MED ORDER — CEPHALEXIN 500 MG PO CAPS: 500.0000 mg | ORAL_CAPSULE | Freq: Four times a day (QID) | ORAL | 0 refills | Status: AC

## 2018-09-03 NOTE — Telephone Encounter (Signed)
Spoke with patient. Advised to continue initial 7 day course of Keflex per Dr Erlene Quan

## 2018-09-03 NOTE — Telephone Encounter (Signed)
-----   Message from [place], RN sent at 08/31/2018  4:41 PM EST -----  ----- Message ----- From: Hollice Espy, MD Sent: 08/31/2018   1:24 PM EST To: Ranell Patrick, RN  Sorry, I was thinking of wrong patient.  This was a walk in for UA/ UCx.  Please treat her with Keflex 500 mg qid x 5 days.  Hollice Espy, MD

## 2018-09-03 NOTE — Telephone Encounter (Signed)
Pt returned call back from [place], I read her the results that were noted and she wants a call back please.

## 2018-09-20 NOTE — Telephone Encounter (Signed)
Pt informed of results per Rosann Auerbach, see additional telephone encounter from 09/03/2018.

## 2018-10-02 ENCOUNTER — Ambulatory Visit: Payer: Medicare Other | Admitting: Urology

## 2018-12-18 ENCOUNTER — Other Ambulatory Visit: Payer: Self-pay | Admitting: Internal Medicine

## 2018-12-18 DIAGNOSIS — Z1231 Encounter for screening mammogram for malignant neoplasm of breast: Secondary | ICD-10-CM

## 2018-12-19 ENCOUNTER — Encounter: Payer: Self-pay | Admitting: Urology

## 2018-12-19 ENCOUNTER — Ambulatory Visit: Payer: Medicare Other | Admitting: Urology

## 2018-12-23 IMAGING — XA IR NEPHROSTOMY PLACEMENT RIGHT
4 series · 5 of 5 positions shown · non-contrast
Comparison: none

CLINICAL DATA: Symptomatic obstructing right nephrolithiasis, post
percutaneous nephrostomy catheter placement 01/17/2018 for
pyonephrosis, now preop for planned percutaneous nephrolithotomy

EXAM:
RIGHT NEPHROSTOMY EXCHANGE FOR ANTEGRADE NEPHROURETERAL CATHETER
UNDER FLUOROSCOPIC GUIDANCE
FLUOROSCOPY TIME:  3.4 min; 895 u4ymI DAP
TECHNIQUE: The procedure, risks (including but not limited to bleeding,
infection, organ damage ), benefits, and alternatives were explained
to the patient. Questions regarding the procedure were encouraged
and answered. The patient understands and consents to the procedure.

[Series 2: fl - angio · 1 of 1 slices shown (1 of 4)]
[im 1/1]
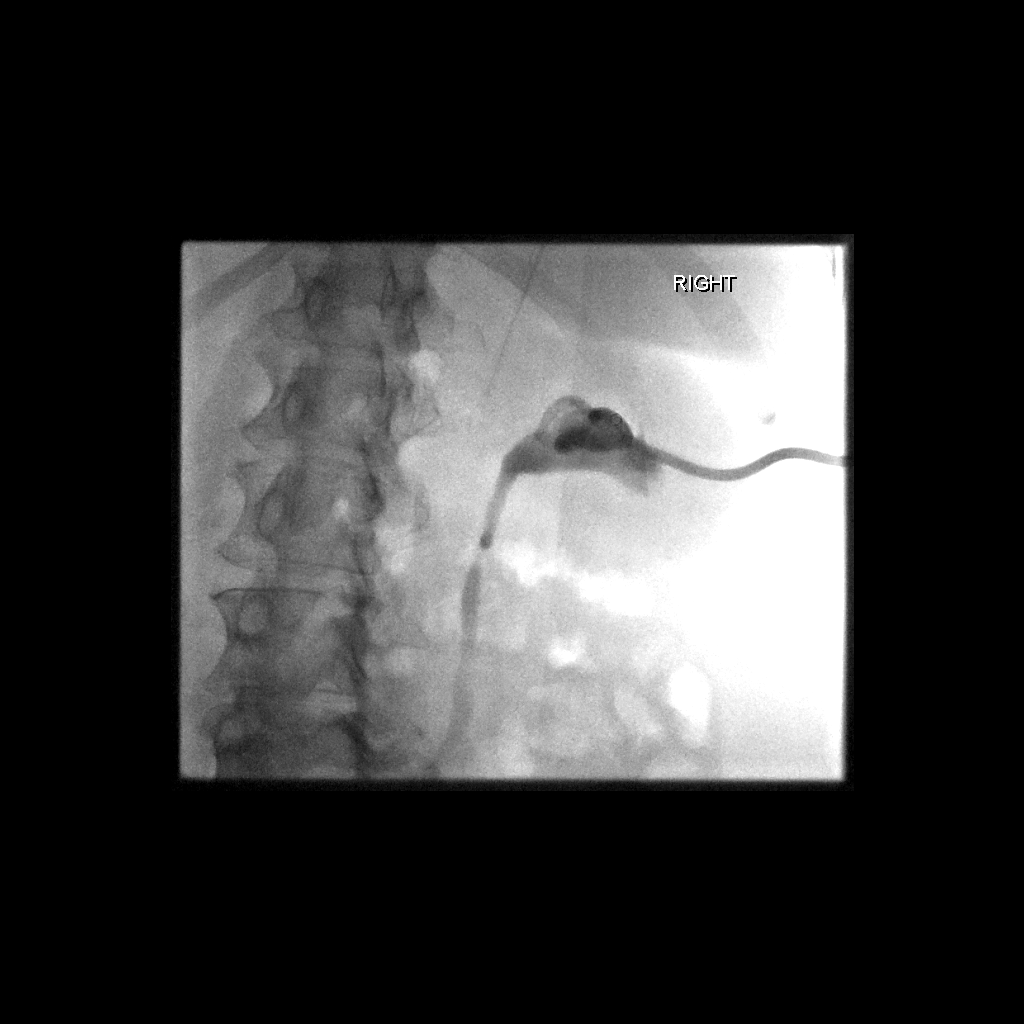

[Series 3: fl - angio · 1 of 1 slices shown (2 of 4)]
[im 1/1]
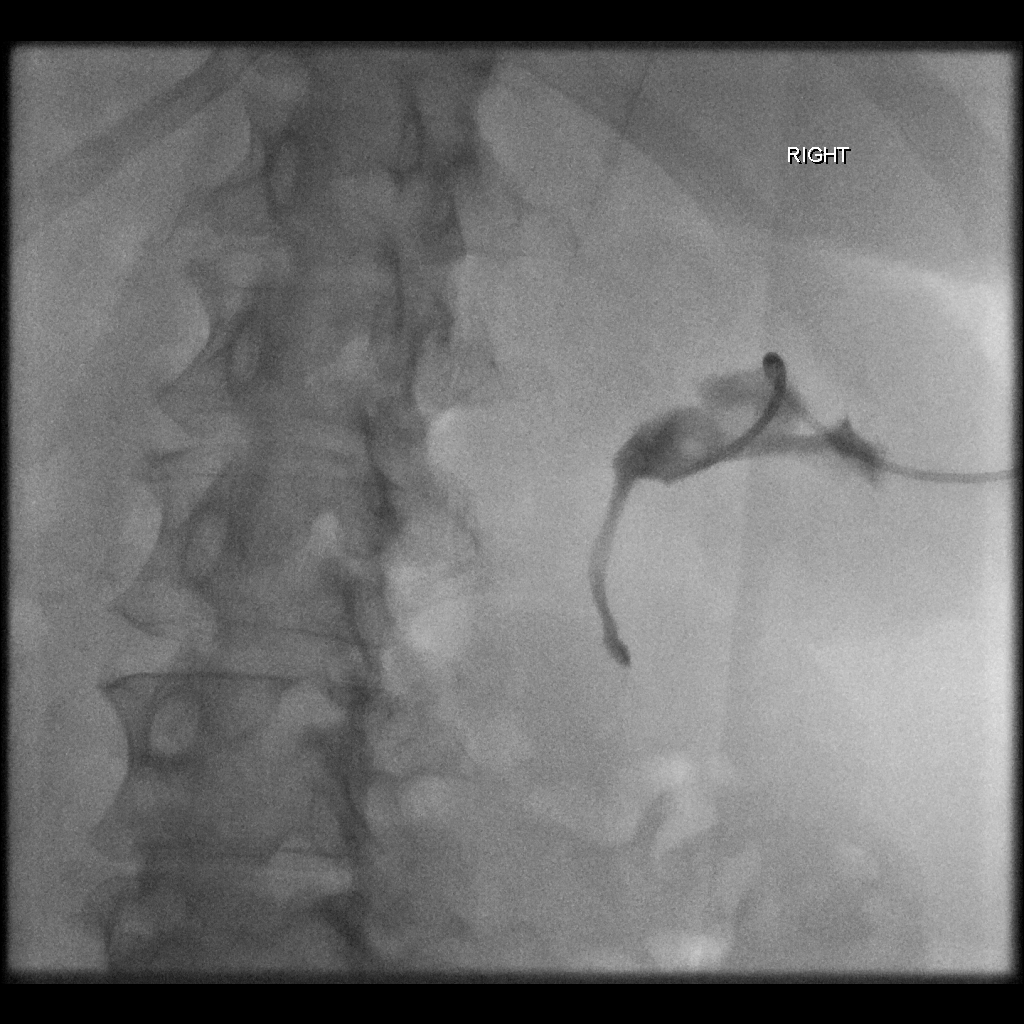

[Series 4: fl - angio · 1 of 1 slices shown (3 of 4)]
[im 1/1]
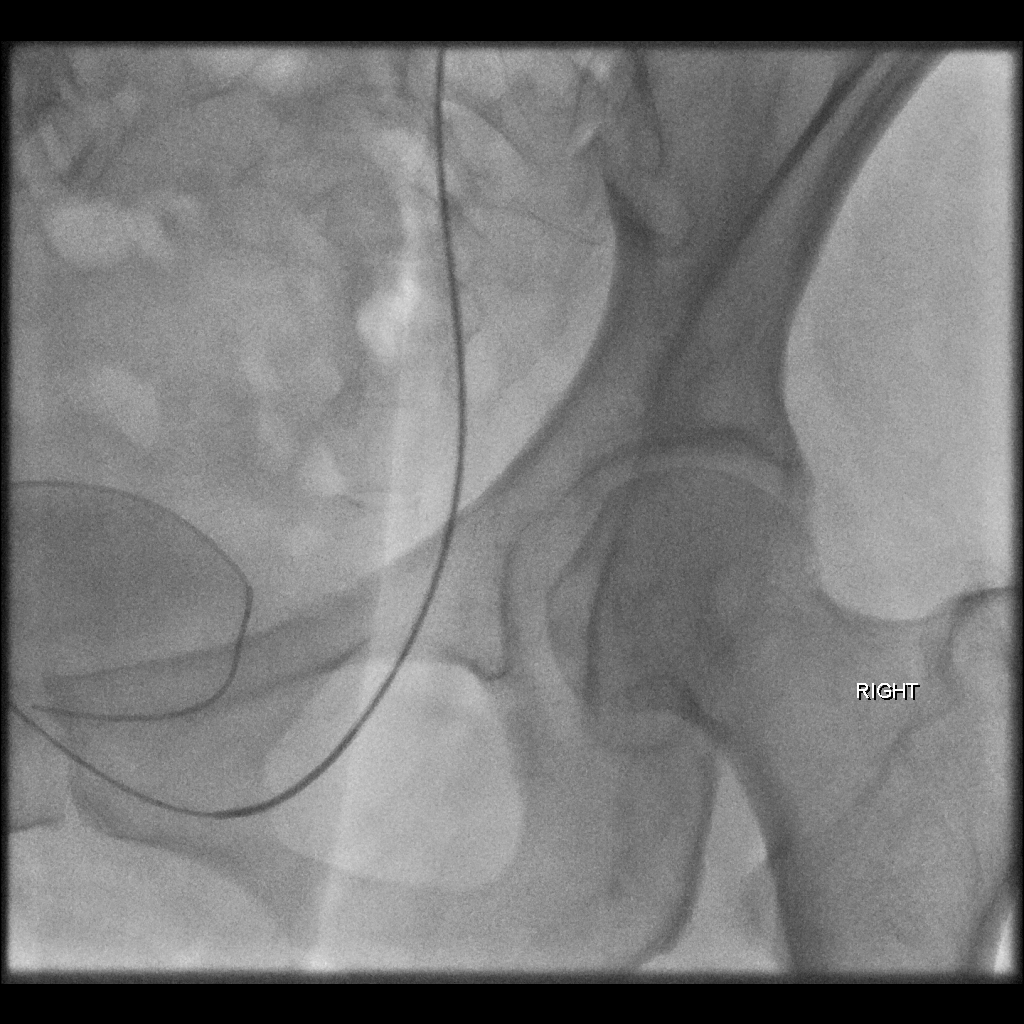

[Series 5: fl - angio · 2 of 2 slices shown (4 of 4)]
[im 1/2]
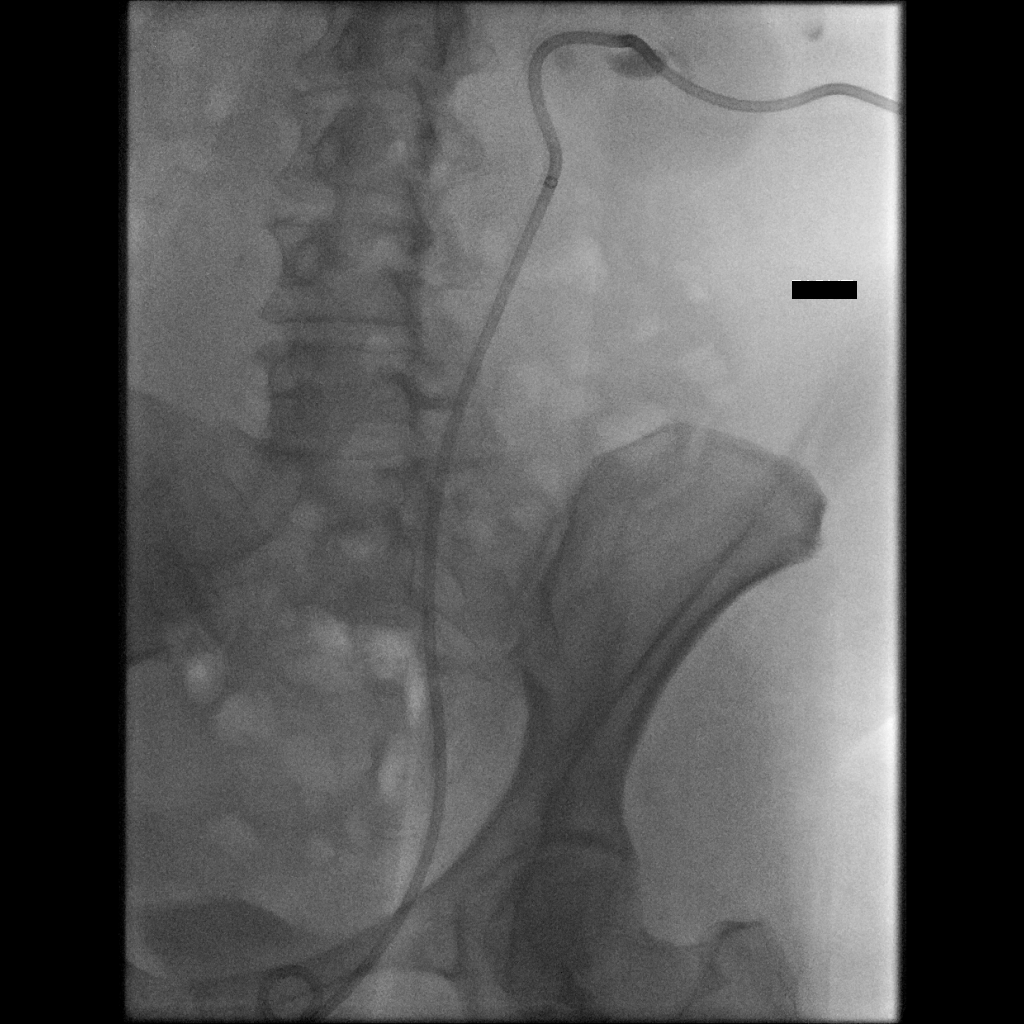
[im 2/2]
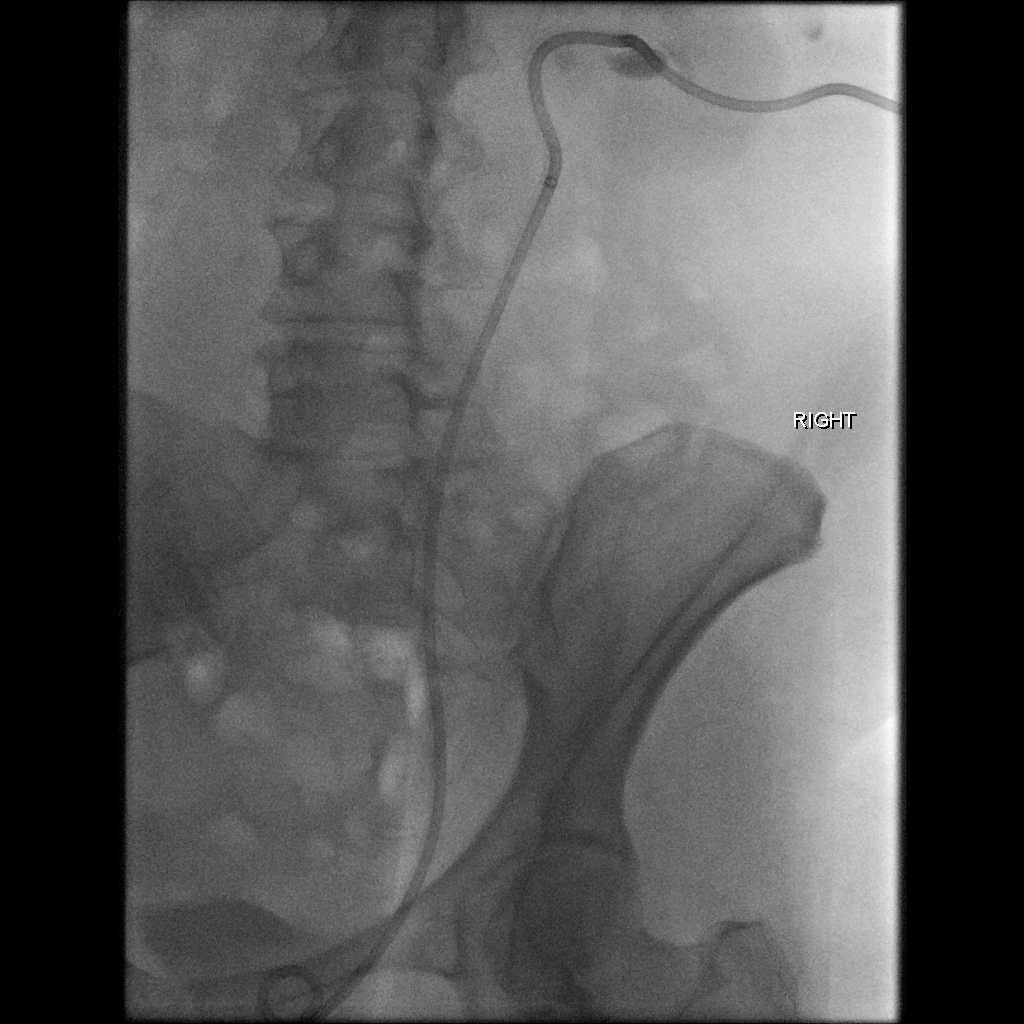

[5 of 5 positions shown; findings below may reference images not displayed]

Previously placed right nephrostomy catheter and surrounding flank
region prepped with Betadine, draped in usual sterile fashion,
infiltrated locally with 1% lidocaine.

Intravenous Fentanyl and Versed were administered as conscious
sedation during continuous cardiorespiratory monitoring by the
radiology RN, with a total moderate sedation time of 16 minutes.

The skin around the drain catheter was infiltrated with 1%
lidocaine. Under fluoroscopy, a small amount of water-soluble
contrast was injected to opacify the renal collecting system. UPJ is
open. The catheter was cut and exchanged over a Bentson wire for a 5
French angled Kumpe catheter, advanced easily down the ureter into
the urinary bladder. The catheter was exchanged over the guidewire
for a 10 French 24 cm nephroureteral catheter, formed with the
distal end in the lumen of the urinary bladder, proximal sideholes
spanning the right renal collecting system. The locking pigtail
could not be completely formed due to small size of right renal
collecting system and presence of multiple calculi. The catheter was
secured externally with 0 silk suture and StatLock, and placed to
external gravity drain bag.

Radiograph confirms appropriate nephroureteral catheter positioning.

The patient tolerated the procedure well.

COMPLICATIONS:
COMPLICATIONS
None.
IMPRESSION: 1. Technically successful exchange of right nephrostomy for right
antegrade nephroureteral catheter, in preparation for percutaneous
nephrolithotomy.

## 2019-01-21 IMAGING — CR DG ABDOMEN 1V
1 series · 2 of 2 positions shown · non-contrast
Comparison: Procedural images 9 [DATE].  CT 01/17/2018.

CLINICAL DATA: Follow-up kidney stone removal in [REDACTED].

EXAM:
ABDOMEN - 1 VIEW

[Series 1: dg abd 1 view · 0.14mm/px · 2 of 2 slices shown]
[im 1/2]
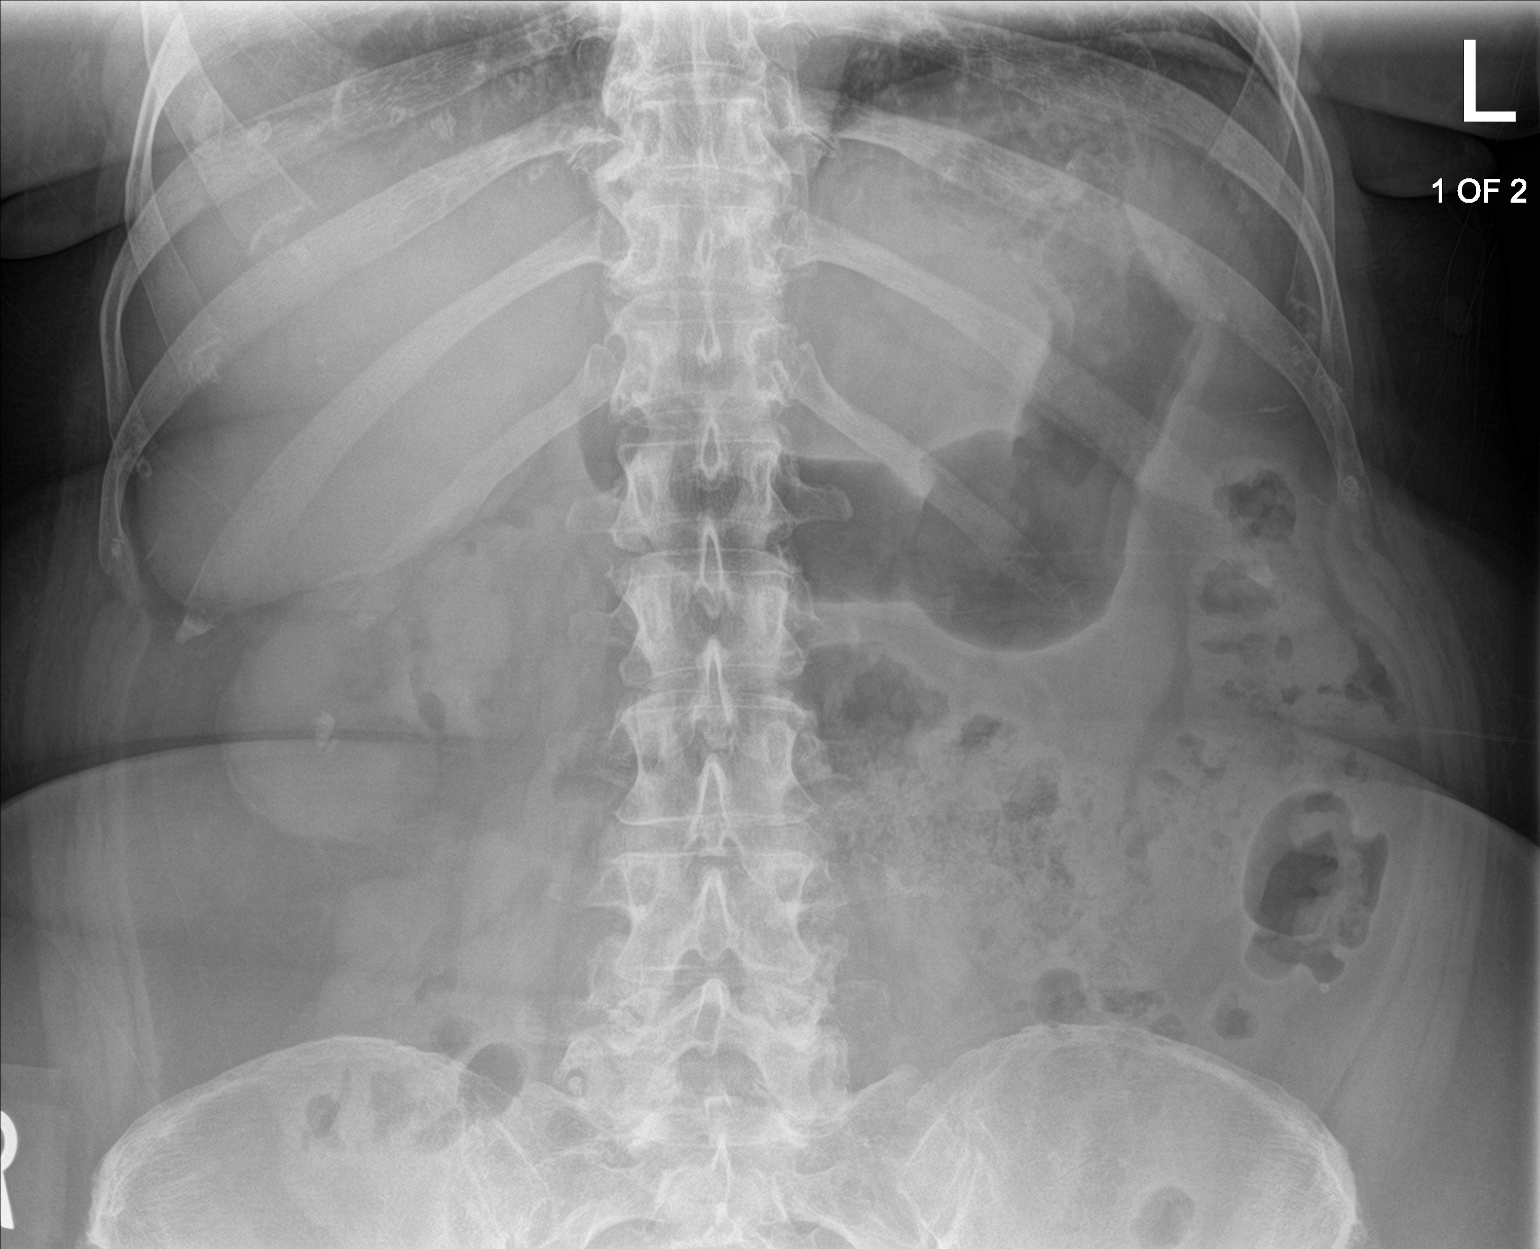
[im 2/2]
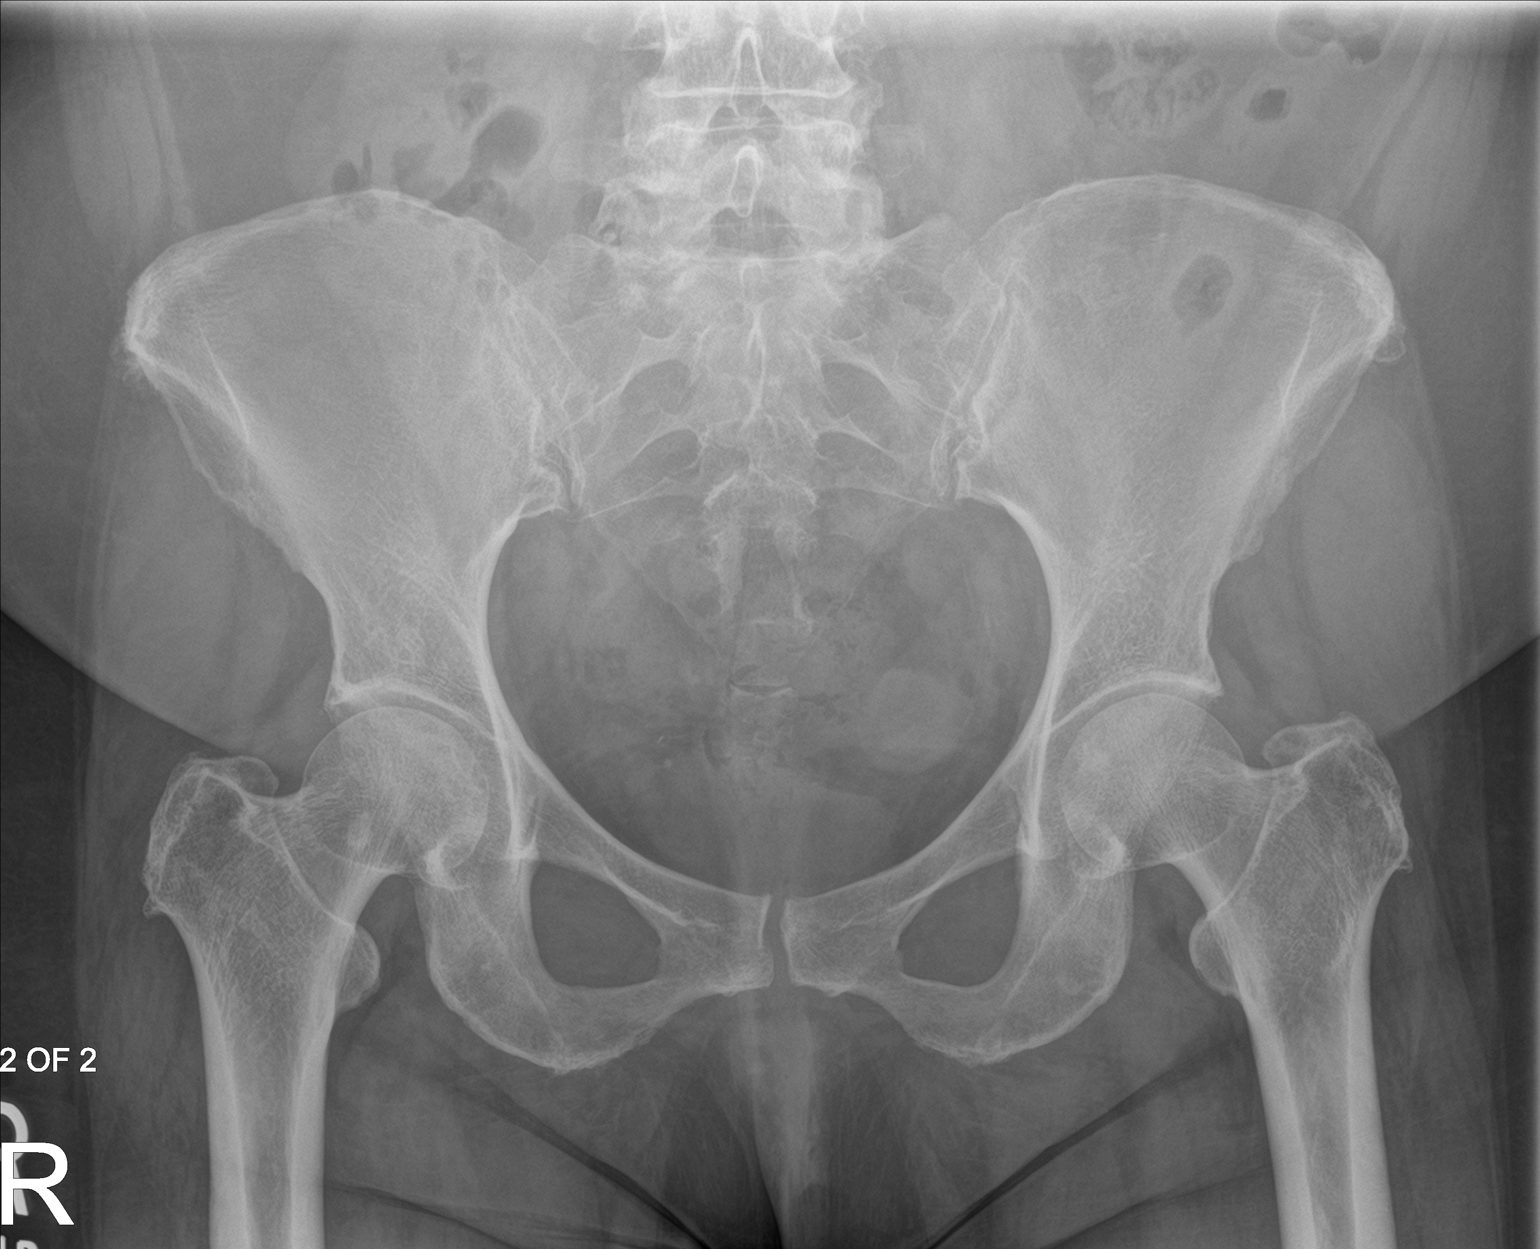

[2 of 2 positions shown; findings below may reference images not displayed]

FINDINGS: No stone disease on the left. On the right, there are 2 stones
visible in the lower pole, the larger measures 1 cm in size and the
smaller measures 4 mm in size. No evidence of ureteral stone. Two
tiny calcifications projected over the right pelvis appear to
represent a phlebolith than a tiny linear density probably related
to previous bowel surgery based on the prior CT.
IMPRESSION: Two residual stones in the lower pole of the right kidney, 10 mm and
4 mm.

## 2019-01-24 ENCOUNTER — Ambulatory Visit
Admission: RE | Admit: 2019-01-24 | Discharge: 2019-01-24 | Disposition: A | Discharge status: Home / Self Care | Payer: Medicare Other | Source: Ambulatory Visit | Attending: Internal Medicine | Admitting: Internal Medicine

## 2019-01-24 DIAGNOSIS — Z1231 Encounter for screening mammogram for malignant neoplasm of breast: Secondary | ICD-10-CM | POA: Insufficient documentation

## 2019-02-05 ENCOUNTER — Ambulatory Visit: Payer: Medicare Other | Admitting: Urology

## 2019-02-05 ENCOUNTER — Ambulatory Visit
Admission: RE | Admit: 2019-02-05 | Discharge: 2019-02-05 | Disposition: A | Discharge status: Home / Self Care | Payer: Medicare Other | Source: Ambulatory Visit | Attending: Urology | Admitting: Urology

## 2019-02-05 ENCOUNTER — Encounter: Payer: Self-pay | Admitting: Urology

## 2019-02-05 ENCOUNTER — Other Ambulatory Visit: Payer: Self-pay

## 2019-02-05 VITALS — BP 181/83 | HR 68 | Ht 63.0 in | Wt 226.0 lb

## 2019-02-05 DIAGNOSIS — N200 Calculus of kidney: Secondary | ICD-10-CM | POA: Diagnosis not present

## 2019-02-05 DIAGNOSIS — N390 Urinary tract infection, site not specified: Secondary | ICD-10-CM

## 2019-02-05 NOTE — Progress Notes (Signed)
02/05/2019 11:54 AM   Emily Velazquez 1950/08/01 [id number]  Referring provider: Tracie Harrier, MD [address] [facility] [place],  [place] [address]  Chief Complaint  Patient presents with  . Nephrolithiasis    Follow up    HPI: 68 year old female with a personal history of recurrent nephrolithiasis returns today for routine 7-month follow-up.  Since her last visit, she did present in March 2020 with UTI type symptoms and grew E. Coli.  She complete resolutions of her UTI symptoms with antibiotics.  She denies any dysuria, urgency frequency or burning today.  Her UA was not checked today.  Around the time of her UTI, we did obtain a KUB which showed an aggregate of about 10 mm worth of right lower pole stones.  No new imaging today at the time of the visit.  She denies any flank pain or recurrent stone episodes.  She has been increasing her water significantly.  Stone analysis primarily calcium oxalate monohydrate 77% calcium oxalate dihydrate 2%, uric acid 15%, calcium phosphate 5%.  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  She has personal history of a 2.4 cm right renal pelvic stone status post right PCNL on 02/2018. She had several previous stone surgeries in the past in the form of shockwave lithotripsy which were not particularly effective.  PMH: Past Medical History:  Diagnosis Date  . Back spasm    VOCAL CORD  . Edema   . Fatty liver    history  . Gout   . Heart murmur   . History of atrioventricular nodal ablation   . History of kidney stones   . Hypertension   . IBS (irritable bowel syndrome)   . Kidney stones   . OAB (overactive bladder)   . PAF (paroxysmal atrial fibrillation) ([place])   . Rosacea     Surgical History: Past Surgical History:  Procedure Laterality Date  . CARDIOVERSION N/A 05/23/2017   Procedure: CARDIOVERSION;  Surgeon: [name], MD;  Location: [facility] ORS;  Service: Cardiovascular;   Laterality: N/A;  . CARDIOVERSION N/A 06/22/2017   Procedure: CARDIOVERSION;  Surgeon: [name], MD;  Location: [facility] ORS;  Service: Cardiovascular;  Laterality: N/A;  . CATARACT EXTRACTION W/PHACO Right 10/26/2016   Procedure: CATARACT EXTRACTION PHACO AND INTRAOCULAR LENS PLACEMENT ([place]) suture placed in right eye at end of procedure;  Surgeon: [name], MD;  Location: [facility] ORS;  Service: Ophthalmology;  Laterality: Right;  [place]  01:21 AP% 23.7 CDE 36.24 Fluid pack lot # [id number]  . COLON SURGERY     RESECTION  . IR NEPHROSTOMY PLACEMENT RIGHT  01/17/2018  . IR NEPHROSTOMY PLACEMENT RIGHT  02/27/2018  . JOINT REPLACEMENT     BIL TKR  . NEPHROLITHOTOMY Right 02/28/2018   Procedure: NEPHROLITHOTOMY PERCUTANEOUS;  Surgeon: Hollice Espy, MD;  Location: [facility] ORS;  Service: Urology;  Laterality: Right;  . REPLACEMENT TOTAL KNEE BILATERAL    . TEE WITHOUT CARDIOVERSION N/A 08/24/2017   Procedure: TRANSESOPHAGEAL ECHOCARDIOGRAM (TEE);  Surgeon: [name], MD;  Location: [facility] ORS;  Service: Cardiovascular;  Laterality: N/A;  . VAGINAL HYSTERECTOMY  1997   complete    Home Medications:  Allergies as of 02/05/2019      Reactions   Cefuroxime Other (See Comments)   Patient states she has extreme abdominal pain and its not really effective for her.   Ace Inhibitors Itching   Codeine Sulfate Itching       Latex Itching, Other (See Comments)  Gloves    Levofloxacin Other (See Comments)   Gastritis.  Onset 08/03/1999.   Shellfish-derived Products Other (See Comments)   Unknown, showed up on allergy test   Sulfa Antibiotics Itching      Medication List       Accurate as of February 05, 2019 11:54 AM. If you have any questions, ask your nurse or doctor.        STOP taking these medications   cephALEXin 500 MG capsule Commonly known as: Keflex Stopped by: Hollice Espy, MD     TAKE these medications   acetaminophen 500 MG tablet Commonly known as: TYLENOL Take  500 mg by mouth every 8 (eight) hours as needed for mild pain.   amiodarone 200 MG tablet Commonly known as: PACERONE Take 200 mg by mouth daily.   amLODipine 5 MG tablet Commonly known as: NORVASC TAKE 1 TABLET(5 MG) BY MOUTH EVERY DAY   cetirizine 10 MG tablet Commonly known as: ZYRTEC Take 10 mg by mouth daily.   Eliquis 5 MG Tabs tablet Generic drug: apixaban Take 5 mg 2 (two) times daily by mouth.   estradiol 0.5 MG tablet Commonly known as: ESTRACE Take 0.5 mg by mouth daily.   metoprolol tartrate 50 MG tablet Commonly known as: LOPRESSOR Take 50 mg by mouth 2 (two) times daily.   valsartan 160 MG tablet Commonly known as: DIOVAN Take 160 mg by mouth daily.       Allergies:  Allergies  Allergen Reactions  . Cefuroxime Other (See Comments)    Patient states she has extreme abdominal pain and its not really effective for her.  . Ace Inhibitors Itching  . Codeine Sulfate Itching        . Latex Itching and Other (See Comments)    Gloves   . Levofloxacin Other (See Comments)    Gastritis.  Onset 08/03/1999.  Marland Kitchen Shellfish-Derived Products Other (See Comments)    Unknown, showed up on allergy test  . Sulfa Antibiotics Itching    Family History: Family History  Problem Relation Age of Onset  . Breast cancer Daughter 45    Social History:  reports that she has never smoked. She has never used smokeless tobacco. She reports that she does not drink alcohol or use drugs.  ROS: UROLOGY Frequent Urination?: No Hard to postpone urination?: No Burning/pain with urination?: No Get up at night to urinate?: No Leakage of urine?: No Urine stream starts and stops?: No Trouble starting stream?: No Do you have to strain to urinate?: No Blood in urine?: No Urinary tract infection?: No Sexually transmitted disease?: No Injury to kidneys or bladder?: No Painful intercourse?: No Weak stream?: No Currently pregnant?: No Vaginal bleeding?: No Last menstrual  period?: N  Gastrointestinal Nausea?: No Vomiting?: No Indigestion/heartburn?: No Diarrhea?: No Constipation?: No  Constitutional Fever: No Night sweats?: No Weight loss?: No Fatigue?: No  Skin Skin rash/lesions?: No Itching?: No  Eyes Blurred vision?: No Double vision?: No  Ears/Nose/Throat Sore throat?: No Sinus problems?: No  Hematologic/Lymphatic Swollen glands?: No Easy bruising?: No  Cardiovascular Leg swelling?: No Chest pain?: No  Respiratory Cough?: No Shortness of breath?: No  Endocrine Excessive thirst?: No  Musculoskeletal Back pain?: No Joint pain?: No  Neurological Headaches?: No Dizziness?: No  Psychologic Depression?: No Anxiety?: No  Physical Exam: BP (!) 181/83   Pulse 68   Ht 5\' 3"  (1.6 m)   Wt 226 lb (102.5 kg)   BMI 40.03 kg/m   Constitutional:  Alert and  oriented, No acute distress. HEENT: [place] AT, moist mucus membranes.  Trachea midline, no masses. Cardiovascular: No clubbing, cyanosis, or edema. Respiratory: Normal respiratory effort, no increased work of breathing. Skin: No rashes, bruises or suspicious lesions. Neurologic: Grossly intact, no focal deficits, moving all 4 extremities. Psychiatric: Normal mood and affect.  Laboratory Data: Lab Results  Component Value Date   WBC 8.2 03/01/2018   HGB 12.0 03/01/2018   HCT 34.3 (L) 03/01/2018   MCV 91.5 03/01/2018   PLT 232 03/01/2018    Lab Results  Component Value Date   CREATININE 1.35 (H) 03/01/2018    Assessment & Plan:    1. Right kidney stone Status post left PCNL last year She has some residual right lower stone burden on most recent KUB in March 2020, may be embedded in PCNL tract Plan for KUB today for comparison We will continue conservative management given that she is asymptomatic and intervene only if stones enlarge or become symptomatic Reviewed stone diet, congratulated on fluid intake Plan for KUB in 1 year if today's KUB is stable - DG  Abd 1 View; Future - DG Abd 1 View; Future  2. Recurrent UTI Treat only for symptomatic UTIs She is asymptomatic today She will follow-up as needed if she has any UTI type symptoms  Return in about 1 year (around 02/05/2020) for KUB (and KUB today).  Hollice Espy, MD  [facility] [address], [place], [place] [address] [phone]

## 2019-02-06 ENCOUNTER — Telehealth: Payer: Self-pay | Admitting: *Deleted

## 2019-02-06 NOTE — Telephone Encounter (Addendum)
Patient informed-verbalized understanding   ----- Message from Hollice Espy, MD sent at 02/06/2019  8:15 AM EDT ----- Stones are stable.  No change.  Great news.    Hollice Espy, MD

## 2019-07-19 ENCOUNTER — Encounter: Payer: Self-pay | Admitting: Physician Assistant

## 2019-07-19 ENCOUNTER — Telehealth: Payer: Self-pay | Admitting: Urology

## 2019-07-19 ENCOUNTER — Other Ambulatory Visit: Payer: Self-pay

## 2019-07-19 ENCOUNTER — Ambulatory Visit: Payer: Medicare Other | Admitting: Physician Assistant

## 2019-07-19 VITALS — BP 152/78 | HR 67 | Ht 63.0 in | Wt 220.0 lb

## 2019-07-19 DIAGNOSIS — R300 Dysuria: Secondary | ICD-10-CM

## 2019-07-19 LAB — URINALYSIS, COMPLETE
Bilirubin, UA: NEGATIVE
Glucose, UA: NEGATIVE
Ketones, UA: NEGATIVE
Nitrite, UA: NEGATIVE
Specific Gravity, UA: 1.02 (ref 1.005–1.030)
Urobilinogen, Ur: 0.2 mg/dL (ref 0.2–1.0)
pH, UA: 5.5 (ref 5.0–7.5)

## 2019-07-19 LAB — MICROSCOPIC EXAMINATION
Bacteria, UA: NONE SEEN
RBC: NONE SEEN /hpf (ref 0–2)

## 2019-07-19 MED ORDER — NITROFURANTOIN MONOHYD MACRO 100 MG PO CAPS: 100.0000 mg | ORAL_CAPSULE | Freq: Two times a day (BID) | ORAL | 0 refills | Status: AC

## 2019-07-19 NOTE — Progress Notes (Signed)
07/19/2019 1:18 PM   Emily Velazquez 18-Jan-1951 [id number]  CC: Dysuria, frequency, urgency  HPI: Emily Velazquez is a 69 y.o. female who presents today for evaluation of possible UTI. She is an established [facility] patient who last saw Dr. Erlene Quan on 02/05/2019 for follow-up of right-sided kidney stones and recurrent UTI.  Today, she reports an approximate 10-day history of dysuria, urinary urgency, and urinary frequency.  She denies fever, chills, nausea, vomiting, gross hematuria, and flank pain.  She does have history of recurrent UTI, most recently with pansensitive E. coli in March 2020.  She does have a history of hospitalization due to urosepsis associated with a partially obstructing right renal pelvic stone in 2019.  In-office UA today positive for trace-intact blood, 1+ protein, and 1+ leukocyte esterase; urine microscopy with 11-30 WBCs/HPF.  She reports allergies to sulfa, levofloxacin, and cefuroxime.  PMH: Past Medical History:  Diagnosis Date  . Back spasm    VOCAL CORD  . Edema   . Fatty liver    history  . Gout   . Heart murmur   . History of atrioventricular nodal ablation   . History of kidney stones   . Hypertension   . IBS (irritable bowel syndrome)   . Kidney stones   . OAB (overactive bladder)   . PAF (paroxysmal atrial fibrillation) ([place])   . Rosacea     Surgical History: Past Surgical History:  Procedure Laterality Date  . CARDIOVERSION N/A 05/23/2017   Procedure: CARDIOVERSION;  Surgeon: [name], MD;  Location: [facility] ORS;  Service: Cardiovascular;  Laterality: N/A;  . CARDIOVERSION N/A 06/22/2017   Procedure: CARDIOVERSION;  Surgeon: [name], MD;  Location: [facility] ORS;  Service: Cardiovascular;  Laterality: N/A;  . CATARACT EXTRACTION W/PHACO Right 10/26/2016   Procedure: CATARACT EXTRACTION PHACO AND INTRAOCULAR LENS PLACEMENT ([place]) suture placed in right eye at end of procedure;  Surgeon: [name], MD;  Location: [facility] ORS;   Service: Ophthalmology;  Laterality: Right;  [place]  01:21 AP% 23.7 CDE 36.24 Fluid pack lot # [id number]  . COLON SURGERY     RESECTION  . IR NEPHROSTOMY PLACEMENT RIGHT  01/17/2018  . IR NEPHROSTOMY PLACEMENT RIGHT  02/27/2018  . JOINT REPLACEMENT     BIL TKR  . NEPHROLITHOTOMY Right 02/28/2018   Procedure: NEPHROLITHOTOMY PERCUTANEOUS;  Surgeon: Hollice Espy, MD;  Location: [facility] ORS;  Service: Urology;  Laterality: Right;  . REPLACEMENT TOTAL KNEE BILATERAL    . TEE WITHOUT CARDIOVERSION N/A 08/24/2017   Procedure: TRANSESOPHAGEAL ECHOCARDIOGRAM (TEE);  Surgeon: [name], MD;  Location: [facility];  Service: Cardiovascular;  Laterality: N/A;  . VAGINAL HYSTERECTOMY  1997   complete    Home Medications:  Allergies as of 07/19/2019      Reactions   Cefuroxime Other (See Comments)   Patient states she has extreme abdominal pain and its not really effective for her.   Ace Inhibitors Itching   Codeine Sulfate Itching       Latex Itching, Other (See Comments)   Gloves    Levofloxacin Other (See Comments)   Gastritis.  Onset 08/03/1999.   Shellfish-derived Products Other (See Comments)   Unknown, showed up on allergy test   Sulfa Antibiotics Itching      Medication List       Accurate as of July 19, 2019  1:18 PM. If you have any questions, ask your nurse or doctor.        acetaminophen 500 MG tablet Commonly  known as: TYLENOL Take 500 mg by mouth every 8 (eight) hours as needed for mild pain.   amiodarone 200 MG tablet Commonly known as: PACERONE Take 200 mg by mouth daily.   amLODipine 5 MG tablet Commonly known as: NORVASC TAKE 1 TABLET(5 MG) BY MOUTH EVERY DAY   cetirizine 10 MG tablet Commonly known as: ZYRTEC Take 10 mg by mouth daily.   colchicine 0.6 MG tablet Take by mouth.   Eliquis 5 MG Tabs tablet Generic drug: apixaban Take 5 mg 2 (two) times daily by mouth.   estradiol 0.5 MG tablet Commonly known as: ESTRACE Take 0.5 mg by mouth daily.     metoprolol tartrate 50 MG tablet Commonly known as: LOPRESSOR Take 50 mg by mouth 2 (two) times daily.   valsartan 160 MG tablet Commonly known as: DIOVAN Take 160 mg by mouth daily.       Allergies:  Allergies  Allergen Reactions  . Cefuroxime Other (See Comments)    Patient states she has extreme abdominal pain and its not really effective for her.  . Ace Inhibitors Itching  . Codeine Sulfate Itching        . Latex Itching and Other (See Comments)    Gloves   . Levofloxacin Other (See Comments)    Gastritis.  Onset 08/03/1999.  Marland Kitchen Shellfish-Derived Products Other (See Comments)    Unknown, showed up on allergy test  . Sulfa Antibiotics Itching    Family History: Family History  Problem Relation Age of Onset  . Breast cancer Daughter 83    Social History:   reports that she has never smoked. She has never used smokeless tobacco. She reports that she does not drink alcohol or use drugs.  ROS: UROLOGY Frequent Urination?: Yes Hard to postpone urination?: Yes Burning/pain with urination?: Yes Get up at night to urinate?: Yes Leakage of urine?: No Urine stream starts and stops?: No Trouble starting stream?: No Do you have to strain to urinate?: No Blood in urine?: No Urinary tract infection?: No Sexually transmitted disease?: No Injury to kidneys or bladder?: No Painful intercourse?: No Weak stream?: No Currently pregnant?: No Vaginal bleeding?: No Last menstrual period?: N  Gastrointestinal Nausea?: No Vomiting?: No Indigestion/heartburn?: No Diarrhea?: No Constipation?: No  Constitutional Fever: No Night sweats?: No Weight loss?: No Fatigue?: No  Skin Skin rash/lesions?: No Itching?: No  Eyes Blurred vision?: No Double vision?: No  Ears/Nose/Throat Sore throat?: No Sinus problems?: No  Hematologic/Lymphatic Swollen glands?: No Easy bruising?: No  Cardiovascular Leg swelling?: No Chest pain?: No  Respiratory Cough?:  No Shortness of breath?: No  Endocrine Excessive thirst?: No  Musculoskeletal Back pain?: No Joint pain?: No  Neurological Headaches?: No Dizziness?: No  Psychologic Depression?: No Anxiety?: No  Physical Exam: BP (!) 152/78   Pulse 67   Ht 5\' 3"  (1.6 m)   Wt 220 lb (99.8 kg)   BMI 38.97 kg/m   Constitutional:  Alert and oriented, no acute distress, nontoxic appearing HEENT: [place], AT Cardiovascular: No clubbing, cyanosis, or edema Respiratory: Normal respiratory effort, no increased work of breathing Skin: No rashes, bruises or suspicious lesions Neurologic: Grossly intact, no focal deficits, moving all 4 extremities Psychiatric: Normal mood and affect  Laboratory Data: Results for orders placed or performed in visit on 07/19/19  CULTURE, URINE COMPREHENSIVE   Specimen: Urine   UR  Result Value Ref Range   Urine Culture, Comprehensive Preliminary report (A)    Organism ID, Bacteria Escherichia coli (A)  Organism ID, Bacteria Comment   Microscopic Examination   URINE  Result Value Ref Range   WBC, UA 11-30 (A) 0 - 5 /hpf   RBC None seen 0 - 2 /hpf   Epithelial Cells (non renal) 0-10 0 - 10 /hpf   Renal Epithel, UA 0-10 (A) None seen /hpf   Bacteria, UA None seen None seen/Few  Urinalysis, Complete  Result Value Ref Range   Specific Gravity, UA 1.020 1.005 - 1.030   pH, UA 5.5 5.0 - 7.5   Color, UA Yellow Yellow   Appearance Ur Cloudy (A) Clear   Leukocytes,UA 1+ (A) Negative   Protein,UA 1+ (A) Negative/Trace   Glucose, UA Negative Negative   Ketones, UA Negative Negative   RBC, UA Trace (A) Negative   Bilirubin, UA Negative Negative   Urobilinogen, Ur 0.2 0.2 - 1.0 mg/dL   Nitrite, UA Negative Negative   Microscopic Examination See below:    Assessment & Plan:   1. Dysuria 69 year old female with PMH recurrent UTI and right sided nephrolithiasis presents with an approximate 10-day history of dysuria, urgency, and frequency.  She denies upper tract  symptoms.  UA notable for moderate pyuria with no other overt infective signs.  Based on symptomology, will start patient on empiric Macrobid today and send urine for culture.  Counseled her that I would contact her if her urine culture results indicated a necessary change in therapy.  She expressed understanding. - Urinalysis, Complete - CULTURE, URINE COMPREHENSIVE  - nitrofurantoin, macrocrystal-monohydrate, (MACROBID) 100 MG capsule; Take 1 capsule (100 mg total) by mouth every 12 (twelve) hours for 5 days.  Dispense: 10 capsule; Refill: 0   Return if symptoms worsen or fail to improve.  Debroah Loop, PA-C  [facility] [address] [place], [place] [address] [phone]

## 2019-07-19 NOTE — Telephone Encounter (Signed)
Pt called today stating she knows she has a UTI and asked to have some antibiotics sent in, explained to pt that antibiotics can't be sent in with out pt being seen and a urine collected for screening. Pt did not want to go to [place] today, added her to Sam's schedule.   FYI

## 2019-07-24 LAB — CULTURE, URINE COMPREHENSIVE

## 2020-02-04 NOTE — Progress Notes (Signed)
02/05/2020 10:54 AM   [name] [birth year] [id number]   Referring provider: Tracie Harrier, MD [address] [facility] [place],  [place] [address] Chief Complaint  Patient presents with  . Nephrolithiasis    HPI: Emily Velazquez is a 69 y.o. female presents today for an annual follow up for personal history of recurrent nephrolithiasis.   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  She has personal history of a 2.4 cm right renal pelvic stone status post right PCNL on 02/2018.She had several previous stone surgeries in the past in the form of shockwave lithotripsy which were not particularly effective.  She presented in March 2020 with UTI type symptoms and grew E. Coli.  She complete resolutions of her UTI symptoms with antibiotics.  She denies any dysuria, urgency frequency or burning today.  Her UA was not checked today.  Around the time of her UTI, we did obtain a KUB which showed an aggregate of about 10 mm worth of right lower pole stones.  Stone analysis primarily calcium oxalate monohydrate 77% calcium oxalate dihydrate 2%, uric acid 15%, calcium phosphate 5%.  She was last evaluated for dysuria, frequency, urgency on 07/19/2019 with Debroah Loop, PA-C. UA positive for trace-intact blood, 1+ protein, and 1+ leukocyte esterase; urine microscopy with 11-30 WBCs/HPF. Treated with Macrobid 100 mg q 12 hrs x 5 days.  No other infections over the past 12 months.  Asymptomatic today.  KUB today shows stable collection of stone in right lower pole, unchanged from last year.   She reports some discomfort at times. She has increased her fluid intake. At night she notes some "dribbles". She wears a panty liner at night. She reports stress incontinence but denies symptoms being bothersome.    PMH: Past Medical History:  Diagnosis Date  . Back spasm    VOCAL CORD  . Edema   . Fatty liver    history  . Gout   . Heart murmur   .  History of atrioventricular nodal ablation   . History of kidney stones   . Hypertension   . IBS (irritable bowel syndrome)   . Kidney stones   . OAB (overactive bladder)   . PAF (paroxysmal atrial fibrillation) ([place])   . Rosacea     Surgical History: Past Surgical History:  Procedure Laterality Date  . CARDIOVERSION N/A 05/23/2017   Procedure: CARDIOVERSION;  Surgeon: [name], MD;  Location: [facility] ORS;  Service: Cardiovascular;  Laterality: N/A;  . CARDIOVERSION N/A 06/22/2017   Procedure: CARDIOVERSION;  Surgeon: [name], MD;  Location: [facility] ORS;  Service: Cardiovascular;  Laterality: N/A;  . CATARACT EXTRACTION W/PHACO Right 10/26/2016   Procedure: CATARACT EXTRACTION PHACO AND INTRAOCULAR LENS PLACEMENT ([place]) suture placed in right eye at end of procedure;  Surgeon: [name], MD;  Location: [facility] ORS;  Service: Ophthalmology;  Laterality: Right;  [place]  01:21 AP% 23.7 CDE 36.24 Fluid pack lot # [id number]  . COLON SURGERY     RESECTION  . IR NEPHROSTOMY PLACEMENT RIGHT  01/17/2018  . IR NEPHROSTOMY PLACEMENT RIGHT  02/27/2018  . JOINT REPLACEMENT     BIL TKR  . NEPHROLITHOTOMY Right 02/28/2018   Procedure: NEPHROLITHOTOMY PERCUTANEOUS;  Surgeon: Hollice Espy, MD;  Location: [facility] ORS;  Service: Urology;  Laterality: Right;  . REPLACEMENT TOTAL KNEE BILATERAL    . TEE WITHOUT CARDIOVERSION N/A 08/24/2017   Procedure: TRANSESOPHAGEAL ECHOCARDIOGRAM (TEE);  Surgeon: [name], MD;  Location: [facility]  ORS;  Service: Cardiovascular;  Laterality: N/A;  . VAGINAL HYSTERECTOMY  1997   complete    Home Medications:  Allergies as of 02/05/2020      Reactions   Cefuroxime Other (See Comments)   Patient states she has extreme abdominal pain and its not really effective for her.   Ace Inhibitors Itching   Codeine Sulfate Itching       Latex Itching, Other (See Comments)   Gloves    Levofloxacin Other (See Comments)   Gastritis.  Onset 08/03/1999.    Shellfish-derived Products Other (See Comments)   Unknown, showed up on allergy test   Sulfa Antibiotics Itching      Medication List       Accurate as of February 05, 2020 11:59 PM. If you have any questions, ask your nurse or doctor.        STOP taking these medications   amLODipine 5 MG tablet Commonly known as: NORVASC Stopped by: Hollice Espy, MD   colchicine 0.6 MG tablet Stopped by: Hollice Espy, MD     TAKE these medications   acetaminophen 500 MG tablet Commonly known as: TYLENOL Take 500 mg by mouth every 8 (eight) hours as needed for mild pain.   amiodarone 200 MG tablet Commonly known as: PACERONE Take 200 mg by mouth daily.   cetirizine 10 MG tablet Commonly known as: ZYRTEC Take 10 mg by mouth daily.   Eliquis 5 MG Tabs tablet Generic drug: apixaban Take 5 mg 2 (two) times daily by mouth.   estradiol 0.5 MG tablet Commonly known as: ESTRACE Take 0.5 mg by mouth daily.   metoprolol tartrate 50 MG tablet Commonly known as: LOPRESSOR Take 50 mg by mouth 2 (two) times daily.   valsartan 160 MG tablet Commonly known as: DIOVAN Take 160 mg by mouth daily.       Allergies:  Allergies  Allergen Reactions  . Cefuroxime Other (See Comments)    Patient states she has extreme abdominal pain and its not really effective for her.  . Ace Inhibitors Itching  . Codeine Sulfate Itching        . Latex Itching and Other (See Comments)    Gloves   . Levofloxacin Other (See Comments)    Gastritis.  Onset 08/03/1999.  Marland Kitchen Shellfish-Derived Products Other (See Comments)    Unknown, showed up on allergy test  . Sulfa Antibiotics Itching    Family History: Family History  Problem Relation Age of Onset  . Breast cancer Daughter 75    Social History:  reports that she has never smoked. She has never used smokeless tobacco. She reports that she does not drink alcohol and does not use drugs.   Physical Exam: BP (!) 148/75   Pulse 76   Constitutional:   Alert and oriented, No acute distress. HEENT: [place] AT, moist mucus membranes.  Trachea midline, no masses. Cardiovascular: No clubbing, cyanosis, or edema. Respiratory: Normal respiratory effort, no increased work of breathing. Skin: No rashes, bruises or suspicious lesions. Neurologic: Grossly intact, no focal deficits, moving all 4 extremities. Psychiatric: Normal mood and affect.  Pertinent image  KUB was personally reviewed today.  She continues to have a collection of 8 mm stone in her right lower pole, unchanged from last year.  This is possibly intraparenchymal stone.   Assessment & Plan:    1. Right kidney stone KUB today is stable and unchanged form last year. We discussed continuing conservative treatment. Will repeat KUB in 2 years.  2. rUTI She has had only 1 infection in the last year.  Return as needed with UTI symptoms  3. Stress incontinence  Symptoms are only mildly bothersome. She prefers conservative management Encouraged her to try pelvic flood exercises. Patient provided with handout.   Return in about 2 years (around 02/04/2022).  [facility] [address], [place], [place] [address] [phone]  I, Selena Batten, am acting as a scribe for Dr. Hollice Espy.  I have reviewed the above documentation for accuracy and completeness, and I agree with the above.   Hollice Espy, MD

## 2020-02-05 ENCOUNTER — Ambulatory Visit
Admission: RE | Admit: 2020-02-05 | Discharge: 2020-02-05 | Disposition: A | Discharge status: Home / Self Care | Payer: Medicare Other | Attending: Urology | Admitting: Urology

## 2020-02-05 ENCOUNTER — Ambulatory Visit
Admission: RE | Admit: 2020-02-05 | Discharge: 2020-02-05 | Disposition: A | Discharge status: Home / Self Care | Payer: Medicare Other | Source: Ambulatory Visit | Attending: Urology | Admitting: Urology

## 2020-02-05 ENCOUNTER — Other Ambulatory Visit: Payer: Self-pay

## 2020-02-05 ENCOUNTER — Ambulatory Visit: Payer: Medicare Other | Admitting: Urology

## 2020-02-05 VITALS — BP 148/75 | HR 76

## 2020-02-05 DIAGNOSIS — N200 Calculus of kidney: Secondary | ICD-10-CM | POA: Insufficient documentation

## 2020-02-05 DIAGNOSIS — N393 Stress incontinence (female) (male): Secondary | ICD-10-CM

## 2020-02-05 DIAGNOSIS — N390 Urinary tract infection, site not specified: Secondary | ICD-10-CM | POA: Diagnosis not present

## 2020-02-05 NOTE — Patient Instructions (Signed)

## 2020-03-05 ENCOUNTER — Other Ambulatory Visit: Payer: Self-pay | Admitting: Internal Medicine

## 2020-03-05 DIAGNOSIS — Z1231 Encounter for screening mammogram for malignant neoplasm of breast: Secondary | ICD-10-CM

## 2020-04-07 ENCOUNTER — Ambulatory Visit
Admission: RE | Admit: 2020-04-07 | Discharge: 2020-04-07 | Disposition: A | Discharge status: Home / Self Care | Payer: Medicare Other | Source: Ambulatory Visit | Attending: Internal Medicine | Admitting: Internal Medicine

## 2020-04-07 ENCOUNTER — Other Ambulatory Visit: Payer: Self-pay

## 2020-04-07 DIAGNOSIS — Z1231 Encounter for screening mammogram for malignant neoplasm of breast: Secondary | ICD-10-CM | POA: Insufficient documentation

## 2021-03-17 ENCOUNTER — Encounter: Payer: Self-pay | Admitting: *Deleted

## 2021-03-18 ENCOUNTER — Encounter: Payer: Self-pay | Admitting: *Deleted

## 2021-03-18 ENCOUNTER — Ambulatory Visit
Admission: RE | Admit: 2021-03-18 | Discharge: 2021-03-18 | Disposition: A | Discharge status: Home / Self Care | Payer: Medicare Other | Source: Ambulatory Visit | Attending: Gastroenterology | Admitting: Gastroenterology

## 2021-03-18 ENCOUNTER — Ambulatory Visit: Payer: Medicare Other | Admitting: Anesthesiology

## 2021-03-18 ENCOUNTER — Encounter
Admission: RE | Disposition: A | Discharge status: Home / Self Care | Payer: Self-pay | Source: Ambulatory Visit | Attending: Gastroenterology

## 2021-03-18 DIAGNOSIS — K635 Polyp of colon: Secondary | ICD-10-CM | POA: Insufficient documentation

## 2021-03-18 DIAGNOSIS — Z980 Intestinal bypass and anastomosis status: Secondary | ICD-10-CM | POA: Diagnosis not present

## 2021-03-18 DIAGNOSIS — K640 First degree hemorrhoids: Secondary | ICD-10-CM | POA: Diagnosis not present

## 2021-03-18 DIAGNOSIS — Z881 Allergy status to other antibiotic agents status: Secondary | ICD-10-CM | POA: Insufficient documentation

## 2021-03-18 DIAGNOSIS — Z7901 Long term (current) use of anticoagulants: Secondary | ICD-10-CM | POA: Insufficient documentation

## 2021-03-18 DIAGNOSIS — Z96653 Presence of artificial knee joint, bilateral: Secondary | ICD-10-CM | POA: Insufficient documentation

## 2021-03-18 DIAGNOSIS — Z9049 Acquired absence of other specified parts of digestive tract: Secondary | ICD-10-CM | POA: Diagnosis not present

## 2021-03-18 DIAGNOSIS — I10 Essential (primary) hypertension: Secondary | ICD-10-CM | POA: Diagnosis not present

## 2021-03-18 DIAGNOSIS — Z885 Allergy status to narcotic agent status: Secondary | ICD-10-CM | POA: Insufficient documentation

## 2021-03-18 DIAGNOSIS — Z882 Allergy status to sulfonamides status: Secondary | ICD-10-CM | POA: Diagnosis not present

## 2021-03-18 DIAGNOSIS — K5730 Diverticulosis of large intestine without perforation or abscess without bleeding: Secondary | ICD-10-CM | POA: Insufficient documentation

## 2021-03-18 DIAGNOSIS — Z90710 Acquired absence of both cervix and uterus: Secondary | ICD-10-CM | POA: Insufficient documentation

## 2021-03-18 DIAGNOSIS — K760 Fatty (change of) liver, not elsewhere classified: Secondary | ICD-10-CM | POA: Diagnosis not present

## 2021-03-18 DIAGNOSIS — Z1211 Encounter for screening for malignant neoplasm of colon: Secondary | ICD-10-CM | POA: Insufficient documentation

## 2021-03-18 DIAGNOSIS — Z91040 Latex allergy status: Secondary | ICD-10-CM | POA: Diagnosis not present

## 2021-03-18 DIAGNOSIS — K633 Ulcer of intestine: Secondary | ICD-10-CM | POA: Diagnosis not present

## 2021-03-18 DIAGNOSIS — Z79899 Other long term (current) drug therapy: Secondary | ICD-10-CM | POA: Diagnosis not present

## 2021-03-18 DIAGNOSIS — Z85828 Personal history of other malignant neoplasm of skin: Secondary | ICD-10-CM | POA: Insufficient documentation

## 2021-03-18 DIAGNOSIS — Z888 Allergy status to other drugs, medicaments and biological substances status: Secondary | ICD-10-CM | POA: Insufficient documentation

## 2021-03-18 DIAGNOSIS — I480 Paroxysmal atrial fibrillation: Secondary | ICD-10-CM | POA: Insufficient documentation

## 2021-03-18 HISTORY — PX: COLONOSCOPY WITH PROPOFOL: SHX5780

## 2021-03-18 HISTORY — DX: Short bowel syndrome, unspecified: K90.829

## 2021-03-18 HISTORY — DX: Other diseases of vocal cords: J38.3

## 2021-03-18 HISTORY — DX: Angina pectoris, unspecified: I20.9

## 2021-03-18 HISTORY — DX: Postsurgical malabsorption, not elsewhere classified: K91.2

## 2021-03-18 HISTORY — DX: Other specified abnormal findings of blood chemistry: R79.89

## 2021-03-18 HISTORY — DX: Unspecified atrial fibrillation: I48.91

## 2021-03-18 HISTORY — DX: Basal cell carcinoma of skin, unspecified: C44.91

## 2021-03-18 HISTORY — DX: Deficiency of other specified B group vitamins: E53.8

## 2021-03-18 HISTORY — DX: Unspecified osteoarthritis, unspecified site: M19.90

## 2021-03-18 HISTORY — DX: Nonrheumatic mitral (valve) insufficiency: I34.0

## 2021-03-18 HISTORY — DX: Thyrotoxicosis with diffuse goiter without thyrotoxic crisis or storm: E05.00

## 2021-03-18 SURGERY — COLONOSCOPY WITH PROPOFOL
Anesthesia: General

## 2021-03-18 MED ORDER — PROPOFOL 500 MG/50ML IV EMUL
INTRAVENOUS | Status: DC | PRN
  Administered 2021-03-18: 150 ug/kg/min via INTRAVENOUS

## 2021-03-18 MED ORDER — SODIUM CHLORIDE 0.9 % IV SOLN: INTRAVENOUS | Status: DC

## 2021-03-18 MED ORDER — PROPOFOL 500 MG/50ML IV EMUL
INTRAVENOUS | Status: AC
  Filled 2021-03-18: qty 50

## 2021-03-18 MED ORDER — PROPOFOL 10 MG/ML IV BOLUS
INTRAVENOUS | Status: DC | PRN
Start: 2021-03-18 — End: 2021-03-18
  Administered 2021-03-18: 70 mg via INTRAVENOUS

## 2021-03-18 MED ORDER — EPHEDRINE SULFATE 50 MG/ML IJ SOLN
INTRAMUSCULAR | Status: DC | PRN
  Administered 2021-03-18: 10 mg via INTRAVENOUS

## 2021-03-18 MED ORDER — LIDOCAINE 2% (20 MG/ML) 5 ML SYRINGE
INTRAMUSCULAR | Status: DC | PRN
  Administered 2021-03-18: 50 mg via INTRAVENOUS

## 2021-03-18 NOTE — Op Note (Signed)
[facility] Patient Name: Emily Velazquez Procedure Date: 03/18/2021 10:04 AM MRN: [id number] Account #: [id number] Date of Birth: 1951-01-09 Admit Type: Outpatient Age: 70 Room: [facility] ENDO ROOM 1 Gender: Female Note Status: Finalized Instrument Name: Colonoscope [id number] Procedure:             Colonoscopy Indications:           Screening for colorectal malignant neoplasm Providers:             Annamaria Helling DO, DO Referring MD:          Tracie Harrier, MD (Referring MD) Medicines:             Monitored Anesthesia Care Complications:         No immediate complications. Estimated blood loss:                         Minimal. Procedure:             Pre-Anesthesia Assessment:                        - Prior to the procedure, a History and Physical was                         performed, and patient medications and allergies were                         reviewed. The patient is competent. The risks and                         benefits of the procedure and the sedation options and                         risks were discussed with the patient. All questions                         were answered and informed consent was obtained.                         Patient identification and proposed procedure were                         verified by the physician, the nurse, the anesthetist                         and the technician in the endoscopy suite. Mental                         Status Examination: alert and oriented. Airway                         Examination: normal oropharyngeal airway and neck                         mobility. Respiratory Examination: clear to                         auscultation. CV Examination: normal. Prophylactic  Antibiotics: The patient does not require prophylactic                         antibiotics. Prior Anticoagulants: The patient has                         taken Eliquis (apixaban), last dose was 2  days prior                         to procedure. ASA Grade Assessment: III - A patient                         with severe systemic disease. After reviewing the                         risks and benefits, the patient was deemed in                         satisfactory condition to undergo the procedure. The                         anesthesia plan was to use monitored anesthesia care                         (MAC). Immediately prior to administration of                         medications, the patient was re-assessed for adequacy                         to receive sedatives. The heart rate, respiratory                         rate, oxygen saturations, blood pressure, adequacy of                         pulmonary ventilation, and response to care were                         monitored throughout the procedure. The physical                         status of the patient was re-assessed after the                         procedure.                        After obtaining informed consent, the colonoscope was                         passed under direct vision. Throughout the procedure,                         the patient's blood pressure, pulse, and oxygen                         saturations were monitored continuously. The  Colonoscope was introduced through the anus and                         advanced to the the ileocolonic anastomosis. The                         colonoscopy was performed without difficulty. The                         patient tolerated the procedure well. The quality of                         the bowel preparation was evaluated using the BBPS                         [facility] Bowel Preparation Scale) with scores of: Right                         Colon = 3, Transverse Colon = 3 and Left Colon = 3                         (entire mucosa seen well with no residual staining,                         small fragments of stool or opaque liquid). The total                          BBPS score equals 9. The appendiceal orifice, neoileum                         (iliocolonic anastamosis) and the rectum were                         photographed. Findings:      The perianal and digital rectal examinations were normal. Pertinent       negatives include normal sphincter tone.      A few small-mouthed diverticula were found in the sigmoid colon.       Estimated blood loss: none.      Non-bleeding internal hemorrhoids were found during retroflexion. The       hemorrhoids were Grade I (internal hemorrhoids that do not prolapse).      There was evidence of a prior end-to-side ileo-colonic anastomosis in       the cecum. This was patent and was characterized by ulceration. The       anastomosis was traversed. Estimated blood loss: none.      The exam was otherwise without abnormality on direct and retroflexion       views.      A 2 to 3 mm polyp was found in the sigmoid colon. The polyp was sessile.       The polyp was removed with a cold biopsy forceps. Resection and       retrieval were complete. Estimated blood loss was minimal. Impression:            - Diverticulosis in the sigmoid colon.                        - Non-bleeding internal hemorrhoids.                        -  Patent end-to-side ileo-colonic anastomosis,                         characterized by ulceration.                        - The examination was otherwise normal on direct and                         retroflexion views.                        - One 2 to 3 mm polyp in the sigmoid colon, removed                         with a cold biopsy forceps. Resected and retrieved. Recommendation:        - Discharge patient to home.                        - Resume previous diet.                        - Resume Eliquis (apixaban) at prior dose tomorrow.                         Refer to referring physician for further adjustment of                         therapy.                        - Continue present  medications.                        - Await pathology results.                        - Repeat colonoscopy for surveillance based on                         pathology results.                        - Return to referring physician as previously                         scheduled. Procedure Code(s):     --- Professional ---                        [phone], Colonoscopy, flexible; with biopsy, single or                         multiple Diagnosis Code(s):     --- Professional ---                        Z12.11, Encounter for screening for malignant neoplasm                         of colon  K64.0, First degree hemorrhoids                        Z98.0, Intestinal bypass and anastomosis status                        K63.5, Polyp of colon                        K57.30, Diverticulosis of large intestine without                         perforation or abscess without bleeding CPT copyright 2019 American Medical Association. All rights reserved. The codes documented in this report are preliminary and upon coder review may  be revised to meet current compliance requirements. Attending Participation:      I personally performed the entire procedure. Volney American, DO Annamaria Helling DO, DO 03/18/2021 10:43:15 AM This report has been signed electronically. Number of Addenda: 0 Note Initiated On: 03/18/2021 10:04 AM Scope Withdrawal Time: 0 hours 11 minutes 2 seconds  Total Procedure Duration: 0 hours 16 minutes 57 seconds  Estimated Blood Loss:  Estimated blood loss was minimal.      [facility]

## 2021-03-18 NOTE — Anesthesia Postprocedure Evaluation (Signed)
Anesthesia Post Note  Patient: Emily Velazquez  Procedure(s) Performed: COLONOSCOPY WITH PROPOFOL  Patient location during evaluation: PACU Anesthesia Type: General Level of consciousness: awake and alert Pain management: pain level controlled Vital Signs Assessment: post-procedure vital signs reviewed and stable Respiratory status: spontaneous breathing, nonlabored ventilation, respiratory function stable and patient connected to nasal cannula oxygen Cardiovascular status: blood pressure returned to baseline and stable Postop Assessment: no apparent nausea or vomiting Anesthetic complications: no   No notable events documented.   Last Vitals:  Vitals:   03/18/21 1031 03/18/21 1041  BP: (!) 121/55 (!) 106/52  Pulse: 68   Resp: 15   Temp: (!) 36.3 C   SpO2: 95%     Last Pain:  Vitals:   03/18/21 1051  TempSrc:   PainSc: 0-No pain                 Sydne Krahl M Treysean Petruzzi

## 2021-03-18 NOTE — Anesthesia Preprocedure Evaluation (Addendum)
Anesthesia Evaluation  Patient identified by MRN, date of birth, ID band Patient awake    Reviewed: Allergy & Precautions, NPO status , Patient's Chart, lab work & pertinent test results  History of Anesthesia Complications Negative for: history of anesthetic complications  Airway Mallampati: II  TM Distance: <3 FB Neck ROM: Full    Dental   Pulmonary  Vocal cord dysfunction - when she is stressed/nervous, her vocal cords spasm, and her voice gets weak/shaky. ENT recommended Botox injections, but she declined   Pulmonary exam normal        Cardiovascular hypertension, + dysrhythmias (a-fib s/p AVN ablation)  Rhythm:Regular Rate:Normal + Systolic murmurs    Neuro/Psych negative neurological ROS  negative psych ROS   GI/Hepatic Steatosis  Short gut syndrome   Endo/Other  negative endocrine ROS  Renal/GU Kidney stones  negative genitourinary   Musculoskeletal  (+) Arthritis ,   Abdominal   Peds  Hematology negative hematology ROS (+)   Anesthesia Other Findings TEE 08/2017 - Left ventricle: The cavity size was normal. Wall thickness was  normal. Systolic function was normal. The estimated ejection  fraction was in the range of 50% to 55%.  - Aortic valve: No evidence of vegetation.  - Mitral valve: There was moderate regurgitation.  - Left atrium: The atrium was dilated.  - Right atrium: No evidence of thrombus in the atrial cavity or  appendage.  - Atrial septum: No defect or patent foramen ovale was identified.  - Tricuspid valve: No evidence of vegetation.  - Pulmonic valve: No evidence of vegetation.   Reproductive/Obstetrics                            Anesthesia Physical Anesthesia Plan  ASA: 3  Anesthesia Plan: General   Post-op Pain Management:    Induction:   PONV Risk Score and Plan:   Airway Management Planned: Natural Airway  Additional Equipment:    Intra-op Plan:   Post-operative Plan:   Informed Consent: I have reviewed the patients History and Physical, chart, labs and discussed the procedure including the risks, benefits and alternatives for the proposed anesthesia with the patient or authorized representative who has indicated his/her understanding and acceptance.       Plan Discussed with: CRNA  Anesthesia Plan Comments: (Note vocal cord spasms with anxiety)        Anesthesia Quick Evaluation

## 2021-03-18 NOTE — H&P (Signed)
[facility] Pre-Procedure H&P   Patient ID: Emily Velazquez is a 70 y.o. female.  Gastroenterology Provider: Annamaria Helling, DO  Referring Provider: Laurine Blazer, PA PCP: Tracie Harrier, MD  Date: 03/18/2021  HPI Emily Velazquez is a 70 y.o. female who presents today for Colonoscopy for screening colonoscopy.  Last colonoscopy 2008- normal. Has h/o small and large bowel resection in 2130Q due to complication from hysterectomy- abscess formation. Has 3-4 loose stools per day on average, no blood. Eliquis held for 2 days prior. No other acute gi complaints.  Past Medical History:  Diagnosis Date   Anginal pain ([place])    Arthritis    Atrial fibrillation ([place])    Basal cell carcinoma    Edema    Fatty liver    history   Gout    Graves disease    Heart murmur    History of atrioventricular nodal ablation    History of kidney stones    Hypertension    IBS (irritable bowel syndrome)    Kidney stones    Low vitamin B12 level    Mitral regurgitation    OAB (overactive bladder)    PAF (paroxysmal atrial fibrillation) (H[facility])    Rosacea    Short gut syndrome    Vocal cord dysfunction     Past Surgical History:  Procedure Laterality Date   APPENDECTOMY     CARDIOVERSION N/A 05/23/2017   Procedure: CARDIOVERSION;  Surgeon: [name], MD;  Location: [facility];  Service: Cardiovascular;  Laterality: N/A;   CARDIOVERSION N/A 06/22/2017   Procedure: CARDIOVERSION;  Surgeon: [name], MD;  Location: [facility];  Service: Cardiovascular;  Laterality: N/A;   CATARACT EXTRACTION W/PHACO Right 10/26/2016   Procedure: CATARACT EXTRACTION PHACO AND INTRAOCULAR LENS PLACEMENT ([place]) suture placed in right eye at end of procedure;  Surgeon: [name], MD;  Location: [facility];  Service: Ophthalmology;  Laterality: Right;  [place]  01:21 AP% 23.7 CDE 36.24 Fluid pack lot # [id number]   COLON SURGERY     RESECTION 2 ft sm/ 2 large intestine    DILATION AND CURETTAGE OF UTERUS     IR NEPHROSTOMY PLACEMENT RIGHT  01/17/2018   IR NEPHROSTOMY PLACEMENT RIGHT  02/27/2018   JOINT REPLACEMENT     BIL TKR   NEPHROLITHOTOMY Right 02/28/2018   Procedure: NEPHROLITHOTOMY PERCUTANEOUS;  Surgeon: Hollice Espy, MD;  Location: [facility];  Service: Urology;  Laterality: Right;   REPLACEMENT TOTAL KNEE BILATERAL     TEE WITHOUT CARDIOVERSION N/A 08/24/2017   Procedure: TRANSESOPHAGEAL ECHOCARDIOGRAM (TEE);  Surgeon: [name], MD;  Location: [facility];  Service: Cardiovascular;  Laterality: N/A;   VAGINAL HYSTERECTOMY  1997   complete    Family History No h/o GI disease or malignancy  Review of Systems  Constitutional:  Negative for activity change, appetite change, fatigue, fever and unexpected weight change.  HENT:  Negative for trouble swallowing and voice change.   Respiratory:  Negative for shortness of breath and wheezing.   Cardiovascular:  Negative for chest pain and palpitations.  Gastrointestinal:  Positive for diarrhea. Negative for abdominal distention, abdominal pain, anal bleeding, blood in stool, constipation, nausea, rectal pain and vomiting.  Musculoskeletal:  Negative for arthralgias and myalgias.  Skin:  Negative for color change and pallor.  Neurological:  Negative for dizziness, syncope and weakness.  Psychiatric/Behavioral:  Negative for confusion.   All other systems reviewed and are negative.   Medications No current facility-administered medications on  file prior to encounter.   Current Outpatient Medications on File Prior to Encounter  Medication Sig Dispense Refill   acetaminophen (TYLENOL) 500 MG tablet Take 500 mg by mouth every 8 (eight) hours as needed for mild pain.      amLODipine (NORVASC) 5 MG tablet Take 5 mg by mouth daily.     cetirizine (ZYRTEC) 10 MG tablet Take 10 mg by mouth daily.     estradiol (ESTRACE) 0.5 MG tablet Take 0.5 mg by mouth daily.     metoprolol tartrate (LOPRESSOR) 50  MG tablet Take 50 mg by mouth 2 (two) times daily.     valsartan (DIOVAN) 160 MG tablet Take 160 mg by mouth daily.     amiodarone (PACERONE) 200 MG tablet Take 200 mg by mouth daily. (Patient not taking: Reported on 03/18/2021)  11   apixaban (ELIQUIS) 5 MG TABS tablet Take 5 mg 2 (two) times daily by mouth.      Pertinent medications related to GI and procedure were reviewed by me with the patient prior to the procedure   Current Facility-Administered Medications:    0.9 %  sodium chloride infusion, , Intravenous, Continuous, Annamaria Helling, DO, Last Rate: 20 mL/hr at 03/18/21 0924, New Bag at 03/18/21 0924  sodium chloride 20 mL/hr at 03/18/21 0258       Allergies  Allergen Reactions   Cefuroxime Other (See Comments)    Patient states she has extreme abdominal pain and its not really effective for her.   Ace Inhibitors Itching   Codeine Sulfate Itching         Glucosamine    Latex Itching and Other (See Comments)    Gloves    Levofloxacin Other (See Comments)    Gastritis.  Onset 08/03/1999.   Shellfish-Derived Products Other (See Comments)    Unknown, showed up on allergy test   Sulfa Antibiotics Itching   Allergies were reviewed by me prior to the procedure  Objective    Vitals:   03/18/21 0903  BP: (!) 176/68  Pulse: 65  Resp: 18  Temp: (!) 96.3 F (35.7 C)  TempSrc: Temporal  SpO2: 100%  Weight: 97.1 kg  Height: 5\' 3"  (1.6 m)     Physical Exam Vitals reviewed.  Constitutional:      General: She is not in acute distress.    Appearance: Normal appearance. She is obese. She is not ill-appearing, toxic-appearing or diaphoretic.  HENT:     Head: Normocephalic and atraumatic.     Nose: Nose normal.     Mouth/Throat:     Mouth: Mucous membranes are moist.     Pharynx: Oropharynx is clear.  Eyes:     General: No scleral icterus.    Extraocular Movements: Extraocular movements intact.  Cardiovascular:     Rate and Rhythm: Normal rate and regular  rhythm.     Heart sounds: Normal heart sounds. No murmur heard.   No friction rub. No gallop.  Pulmonary:     Effort: Pulmonary effort is normal. No respiratory distress.     Breath sounds: Normal breath sounds. No wheezing, rhonchi or rales.  Abdominal:     General: Bowel sounds are normal. There is no distension.     Palpations: Abdomen is soft.     Tenderness: There is no abdominal tenderness. There is no guarding or rebound.  Musculoskeletal:     Cervical back: Neck supple.     Right lower leg: No edema.     Left lower  leg: No edema.  Skin:    General: Skin is warm and dry.     Coloration: Skin is not jaundiced or pale.  Neurological:     General: No focal deficit present.     Mental Status: She is alert and oriented to person, place, and time. Mental status is at baseline.  Psychiatric:        Mood and Affect: Mood normal.        Behavior: Behavior normal.        Thought Content: Thought content normal.        Judgment: Judgment normal.     Assessment:  Emily Velazquez is a 70 y.o. female  who presents today for Colonoscopy for screening colonoscopy.  Plan:  Colonoscopy with possible intervention today Eliquis held for 2 days prior  Colonoscopy with possible biopsy, control of bleeding, polypectomy, and interventions as necessary has been discussed with the patient/patient representative. Informed consent was obtained from the patient/patient representative after explaining the indication, nature, and risks of the procedure including but not limited to death, bleeding, perforation, missed neoplasm/lesions, cardiorespiratory compromise, and reaction to medications. Opportunity for questions was given and appropriate answers were provided. Patient/patient representative has verbalized understanding is amenable to undergoing the procedure.   Annamaria Helling, DO  [facility]  Portions of the record may have been created with voice recognition software.  Occasional wrong-word or 'sound-a-like' substitutions may have occurred due to the inherent limitations of voice recognition software.  Read the chart carefully and recognize, using context, where substitutions may have occurred.

## 2021-03-18 NOTE — Interval H&P Note (Signed)
History and Physical Interval Note: Preprocedure H&P from 03/18/21  was reviewed and there was no interval change after seeing and examining the patient.  Written consent was obtained from the patient after discussion of risks, benefits, and alternatives. Patient has consented to proceed with Colonoscopy with possible intervention   03/18/2021 10:05 AM  Emily Velazquez  has presented today for surgery, with the diagnosis of Screening (Z12.11), A-fib (I48.20.  The various methods of treatment have been discussed with the patient and family. After consideration of risks, benefits and other options for treatment, the patient has consented to  Procedure(s): COLONOSCOPY WITH PROPOFOL (N/A) as a surgical intervention.  The patient's history has been reviewed, patient examined, no change in status, stable for surgery.  I have reviewed the patient's chart and labs.  Questions were answered to the patient's satisfaction.     Annamaria Helling

## 2021-03-18 NOTE — Transfer of Care (Signed)
Immediate Anesthesia Transfer of Care Note  Patient: Emily Velazquez  Procedure(s) Performed: COLONOSCOPY WITH PROPOFOL  Patient Location: Endoscopy Unit  Anesthesia Type:General  Level of Consciousness: sedated  Airway & Oxygen Therapy: Patient Spontanous Breathing  Post-op Assessment: Report given to RN and Post -op Vital signs reviewed and stable  Post vital signs: stable  Last Vitals:  Vitals Value Taken Time  BP 121/55 03/18/21 1031  Temp 36.3 C 03/18/21 1031  Pulse 76 03/18/21 1033  Resp 14 03/18/21 1033  SpO2 94 % 03/18/21 1033  Vitals shown include unvalidated device data.  Last Pain:  Vitals:   03/18/21 1031  TempSrc: Temporal  PainSc: 0-No pain         Complications: No notable events documented.

## 2021-03-19 ENCOUNTER — Encounter: Payer: Self-pay | Admitting: Gastroenterology

## 2021-03-19 LAB — SURGICAL PATHOLOGY

## 2021-03-30 ENCOUNTER — Other Ambulatory Visit: Payer: Self-pay | Admitting: Internal Medicine

## 2021-03-30 DIAGNOSIS — Z1231 Encounter for screening mammogram for malignant neoplasm of breast: Secondary | ICD-10-CM

## 2021-04-14 ENCOUNTER — Other Ambulatory Visit: Payer: Self-pay

## 2021-04-14 ENCOUNTER — Ambulatory Visit
Admission: RE | Admit: 2021-04-14 | Discharge: 2021-04-14 | Disposition: A | Discharge status: Home / Self Care | Payer: Medicare Other | Source: Ambulatory Visit | Attending: Internal Medicine | Admitting: Internal Medicine

## 2021-04-14 DIAGNOSIS — Z1231 Encounter for screening mammogram for malignant neoplasm of breast: Secondary | ICD-10-CM | POA: Diagnosis not present

## 2021-07-03 ENCOUNTER — Ambulatory Visit
Admission: RE | Admit: 2021-07-03 | Discharge: 2021-07-03 | Disposition: A | Discharge status: Home / Self Care | Payer: Medicare Other | Source: Ambulatory Visit | Attending: Family Medicine | Admitting: Family Medicine

## 2021-07-03 VITALS — BP 139/84 | HR 63 | Temp 99.0°F | Resp 18

## 2021-07-03 DIAGNOSIS — J019 Acute sinusitis, unspecified: Secondary | ICD-10-CM

## 2021-07-03 MED ORDER — AZITHROMYCIN 250 MG PO TABS: ORAL_TABLET | ORAL | 0 refills | Status: DC

## 2021-07-03 MED ORDER — BENZONATATE 100 MG PO CAPS: 200.0000 mg | ORAL_CAPSULE | Freq: Three times a day (TID) | ORAL | 0 refills | Status: DC | PRN

## 2021-07-03 NOTE — ED Triage Notes (Signed)
Pt presents with runny nose, cough, and sneezing x 2 weeks.

## 2021-07-03 NOTE — ED Provider Notes (Signed)
Roderic Palau    CSN: [id number] Arrival date & time: 07/03/21  0941      History   Chief Complaint Chief Complaint  Patient presents with   Nasal Congestion   Cough    HPI Emily Velazquez is a 71 y.o. female.   HPI Patient presents today with with two week history of nasal congestion and cough x 2 weeks. Denies fever, shortness of breath, or chest tightness. Denies known contact with anyone positive for COVID or Influenza.  She has not tested for COVID. She doesn't feel current symptoms are related to COVID. She has remained afebrile. She has been taking Coricidin for symptoms, however without any improvement.  Past Medical History:  Diagnosis Date   Anginal pain ([place])    Arthritis    Atrial fibrillation ([place])    Basal cell carcinoma    Edema    Fatty liver    history   Gout    Graves disease    Heart murmur    History of atrioventricular nodal ablation    History of kidney stones    Hypertension    IBS (irritable bowel syndrome)    Kidney stones    Low vitamin B12 level    Mitral regurgitation    OAB (overactive bladder)    PAF (paroxysmal atrial fibrillation) (HCC)    Rosacea    Short gut syndrome    Vocal cord dysfunction     Patient Active Problem List   Diagnosis Date Noted   Severe sepsis ([place]) 01/17/2018   Right kidney stone 01/17/2018   Acute pyelonephritis 01/17/2018   HTN (hypertension) 01/17/2018   PAF (paroxysmal atrial fibrillation) ([place]) 01/17/2018   AKI (acute kidney injury) ([place]) 01/17/2018    Past Surgical History:  Procedure Laterality Date   APPENDECTOMY     CARDIOVERSION N/A 05/23/2017   Procedure: CARDIOVERSION;  Surgeon: [name], MD;  Location: [facility] ORS;  Service: Cardiovascular;  Laterality: N/A;   CARDIOVERSION N/A 06/22/2017   Procedure: CARDIOVERSION;  Surgeon: [name], MD;  Location: [facility];  Service: Cardiovascular;  Laterality: N/A;   CATARACT EXTRACTION W/PHACO Right 10/26/2016   Procedure:  CATARACT EXTRACTION PHACO AND INTRAOCULAR LENS PLACEMENT ([place]) suture placed in right eye at end of procedure;  Surgeon: [name], MD;  Location: [facility] OR[facility];  Service: Ophthalmology;  Laterality: Right;  [place]  01:21 AP% 23.7 CDE 36.24 Fluid pack lot # [id number]   COLON SURGERY     RESECTION 2 ft sm/ 2 large intestine   COLONOSCOPY WITH PROPOFOL N/A 03/18/2021   Procedure: COLONOSCOPY WITH PROPOFOL;  Surgeon: Annamaria Helling, DO;  Location: [facility];  Service: Gastroenterology;  Laterality: N/A;   DILATION AND CURETTAGE OF UTERUS     IR NEPHROSTOMY PLACEMENT RIGHT  01/17/2018   IR NEPHROSTOMY PLACEMENT RIGHT  02/27/2018   JOINT REPLACEMENT     BIL TKR   NEPHROLITHOTOMY Right 02/28/2018   Procedure: NEPHROLITHOTOMY PERCUTANEOUS;  Surgeon: Hollice Espy, MD;  Location: [facility];  Service: Urology;  Laterality: Right;   REPLACEMENT TOTAL KNEE BILATERAL     TEE WITHOUT CARDIOVERSION N/A 08/24/2017   Procedure: TRANSESOPHAGEAL ECHOCARDIOGRAM (TEE);  Surgeon: [name], MD;  Location: [facility];  Service: Cardiovascular;  Laterality: N/A;   VAGINAL HYSTERECTOMY  1997   complete    OB History   No obstetric history on file.      Home Medications    Prior to Admission medications   Medication Sig Start Date End  Date Taking? Authorizing Provider  azithromycin (ZITHROMAX) 250 MG tablet Take 2 tabs PO x 1 dose, then 1 tab PO QD x 4 days 07/03/21  Yes Scot Jun, FNP  benzonatate (TESSALON) 100 MG capsule Take 2 capsules (200 mg total) by mouth 3 (three) times daily as needed for cough. 07/03/21  Yes Scot Jun, FNP  acetaminophen (TYLENOL) 500 MG tablet Take 500 mg by mouth every 8 (eight) hours as needed for mild pain.     [provider]  amiodarone (PACERONE) 200 MG tablet Take 200 mg by mouth daily. Patient not taking: Reported on 03/18/2021 10/26/17   [provider]  amLODipine (NORVASC) 5 MG tablet Take 5 mg by mouth daily.     [provider]  apixaban (ELIQUIS) 5 MG TABS tablet Take 5 mg 2 (two) times daily by mouth.    [provider]  cetirizine (ZYRTEC) 10 MG tablet Take 10 mg by mouth daily.    [provider]  estradiol (ESTRACE) 0.5 MG tablet Take 0.5 mg by mouth daily. 09/04/16   [provider]  metoprolol tartrate (LOPRESSOR) 50 MG tablet Take 50 mg by mouth 2 (two) times daily.    [provider]  valsartan (DIOVAN) 160 MG tablet Take 160 mg by mouth daily. 12/18/18   [provider]    Family History Family History  Problem Relation Age of Onset   Breast cancer Daughter 28    Social History Social History   Tobacco Use   Smoking status: Never   Smokeless tobacco: Never  Vaping Use   Vaping Use: Never used  Substance Use Topics   Alcohol use: No   Drug use: No     Allergies   Cefuroxime, Ace inhibitors, Codeine sulfate, Glucosamine, Latex, Levofloxacin, Shellfish-derived products, and Sulfa antibiotics   Review of Systems Review of Systems Pertinent negatives listed in HPI   Physical Exam Triage Vital Signs ED Triage Vitals  Enc Vitals Group     BP 07/03/21 1006 139/84     Pulse Rate 07/03/21 1006 63     Resp 07/03/21 1006 18     Temp 07/03/21 1006 99 F (37.2 C)     Temp Source 07/03/21 1006 Oral     SpO2 07/03/21 1006 96 %     Weight --      Height --      Head Circumference --      Peak Flow --      Pain Score 07/03/21 1007 0     Pain Loc --      Pain Edu? --      Excl. in [place]? --    No data found.  Updated Vital Signs BP 139/84 (BP Location: Left Arm)    Pulse 63    Temp 99 F (37.2 C) (Oral)    Resp 18    SpO2 96%   Visual Acuity Right Eye Distance:   Left Eye Distance:   Bilateral Distance:    Right Eye Near:   Left Eye Near:    Bilateral Near:     Physical Exam  General Appearance:    Alert, cooperative, no distress  HENT:   Normocephalic, ears normal, nares mucosal edema with congestion,  rhinorrhea, oropharynx without swelling or erythema   Eyes:    PERRL, conjunctiva/corneas clear, EOM's intact       Lungs:     Clear to auscultation bilaterally, respirations unlabored  Heart:    Regular rate and rhythm  Neurologic:   Awake, alert, oriented x 3. No apparent focal neurological           defect.      UC Treatments / Results  Labs (all labs ordered are listed, but only abnormal results are displayed) Labs Reviewed - No data to display  EKG   Radiology No results found.  Procedures Procedures (including critical care time)  Medications Ordered in UC Medications - No data to display  Initial Impression / Assessment and Plan / UC Course  I have reviewed the triage vital signs and the nursing notes.  Pertinent labs & imaging results that were available during my care of the patient were reviewed by me and considered in my medical decision making (see chart for details).   Acute sinusitis, treatment with Azithromycin and Benzonatate. Hydrate well with fluids. RTC PRN  Final Clinical Impressions(s) / UC Diagnoses   Final diagnoses:  Acute non-recurrent sinusitis, unspecified location   Discharge Instructions   None    ED Prescriptions     Medication Sig Dispense Auth. Provider   benzonatate (TESSALON) 100 MG capsule Take 2 capsules (200 mg total) by mouth 3 (three) times daily as needed for cough. 30 capsule Scot Jun, FNP   azithromycin (ZITHROMAX) 250 MG tablet Take 2 tabs PO x 1 dose, then 1 tab PO QD x 4 days 6 tablet Scot Jun, FNP      PDMP not reviewed this encounter.   Scot Jun, FNP 07/03/21 1105

## 2021-09-30 ENCOUNTER — Ambulatory Visit: Payer: Medicare Other | Admitting: Physician Assistant

## 2021-09-30 ENCOUNTER — Encounter: Payer: Self-pay | Admitting: Physician Assistant

## 2021-09-30 VITALS — BP 150/83 | HR 71 | Ht 63.0 in | Wt 214.0 lb

## 2021-09-30 DIAGNOSIS — R300 Dysuria: Secondary | ICD-10-CM | POA: Diagnosis not present

## 2021-09-30 DIAGNOSIS — N390 Urinary tract infection, site not specified: Secondary | ICD-10-CM | POA: Diagnosis not present

## 2021-09-30 LAB — MICROSCOPIC EXAMINATION: WBC, UA: >30 /hpf — ABNORMAL HIGH (ref 0–5)

## 2021-09-30 LAB — URINALYSIS, COMPLETE
Bilirubin, UA: NEGATIVE
Glucose, UA: NEGATIVE
Ketones, UA: NEGATIVE
Nitrite, UA: POSITIVE — AB
RBC, UA: NEGATIVE
Specific Gravity, UA: >1.03 — ABNORMAL HIGH (ref 1.005–1.030)
Urobilinogen, Ur: 0.2 mg/dL (ref 0.2–1.0)
pH, UA: 5.5 (ref 5.0–7.5)

## 2021-09-30 LAB — BLADDER SCAN AMB NON-IMAGING: Scan Result: 0

## 2021-09-30 MED ORDER — NITROFURANTOIN MONOHYD MACRO 100 MG PO CAPS: 100.0000 mg | ORAL_CAPSULE | Freq: Two times a day (BID) | ORAL | 0 refills | Status: AC

## 2021-09-30 NOTE — Progress Notes (Signed)
? ?09/30/2021 ?1:48 PM  ? ?Emily Velazquez ?01/28/1951 ?[id number] ? ?CC: ?Chief Complaint  ?Patient presents with  ? Urinary Tract Infection  ? ?HPI: ?Emily Velazquez is a 71 y.o. female with PMH of nephrolithiasis who presents today for evaluation of possible UTI.  ? ?Today she reports an approximate 1 week history of frequency, dysuria, and low back pain.  She denies fever, chills, nausea, vomiting, flank pain, and gross hematuria.  She has not taken any medication to manage her pain at home. ? ?In-office UA today positive for 1+ protein, nitrites, and 1+ leukocyte esterase; urine microscopy with >30 WBCs/HPF, and many bacteria. PVR 74m. ? ?PMH: ?Past Medical History:  ?Diagnosis Date  ? Anginal pain ([place]   ? Arthritis   ? Atrial fibrillation ([place]   ? Basal cell carcinoma   ? Edema   ? Fatty liver   ? history  ? Gout   ? Graves disease   ? Heart murmur   ? History of atrioventricular nodal ablation   ? History of kidney stones   ? Hypertension   ? IBS (irritable bowel syndrome)   ? Kidney stones   ? Low vitamin B12 level   ? Mitral regurgitation   ? OAB (overactive bladder)   ? PAF (paroxysmal atrial fibrillation) ([place]   ? Rosacea   ? Short gut syndrome   ? Vocal cord dysfunction   ? ? ?Surgical History: ?Past Surgical History:  ?Procedure Laterality Date  ? APPENDECTOMY    ? CARDIOVERSION N/A 05/23/2017  ? Procedure: CARDIOVERSION;  Surgeon: KCorey Skains MD;  Location: [facility];  Service: Cardiovascular;  Laterality: N/A;  ? CARDIOVERSION N/A 06/22/2017  ? Procedure: CARDIOVERSION;  Surgeon: KCorey Skains MD;  Location: [facility];  Service: Cardiovascular;  Laterality: N/A;  ? CATARACT EXTRACTION W/PHACO Right 10/26/2016  ? Procedure: CATARACT EXTRACTION PHACO AND INTRAOCULAR LENS PLACEMENT ([place] suture placed in right eye at end of procedure;  Surgeon: SEstill Cotta MD;  Location: [facility];  Service: Ophthalmology;  Laterality: Right;  [place] 01:21 ?AP% 23.7 ?CDE 36.24 ?Fluid pack lot # [id number]  ?  COLON SURGERY    ? RESECTION 2 ft sm/ 2 large intestine  ? COLONOSCOPY WITH PROPOFOL N/A 03/18/2021  ? Procedure: COLONOSCOPY WITH PROPOFOL;  Surgeon: RAnnamaria Helling DO;  [facility];  Service: Gastroenterology;  Laterality: N/A;  ? DILATION AND CURETTAGE OF UTERUS    ? IR NEPHROSTOMY PLACEMENT RIGHT  01/17/2018  ? IR NEPHROSTOMY PLACEMENT RIGHT  02/27/2018  ? JOINT REPLACEMENT    ? BIL TKR  ? NEPHROLITHOTOMY Right 02/28/2018  ? Procedure: NEPHROLITHOTOMY PERCUTANEOUS;  Surgeon: BHollice Espy MD;  Location: [facility];  Service: Urology;  Laterality: Right;  ? REPLACEMENT TOTAL KNEE BILATERAL    ? TEE WITHOUT CARDIOVERSION N/A 08/24/2017  ? Procedure: TRANSESOPHAGEAL ECHOCARDIOGRAM (TEE);  Surgeon: KCorey Skains MD;  Location: [facility];  Service: Cardiovascular;  Laterality: N/A;  ? [place] ? complete  ? ? ?Home Medications:  ?Allergies as of 09/30/2021   ? ?   Reactions  ? Cefuroxime Other (See Comments)  ? Patient states she has extreme abdominal pain and its not really effective for her.  ? Ace Inhibitors Itching  ? Codeine Sulfate Itching  ?    ? Glucosamine   ? Latex Itching, Other (See Comments)  ? Gloves   ? Levofloxacin Other (See Comments)  ? Gastritis.  Onset 08/03/1999.  ? Shellfish-derived Products Other (See  Comments)  ? Unknown, showed up on allergy test  ? Sulfa Antibiotics Itching  ? ?  ? ?  ?Medication List  ?  ? ?  ? Accurate as of September 30, 2021  1:48 PM. If you have any questions, ask your nurse or doctor.  ?  ?  ? ?  ? ?STOP taking these medications   ? ?apixaban 5 MG Tabs tablet ?Commonly known as: ELIQUIS ?Stopped by: Debroah Loop, PA-C ?  ?azithromycin 250 MG tablet ?Commonly known as: ZITHROMAX ?Stopped by: Debroah Loop, PA-C ?  ? ?  ? ?TAKE these medications   ? ?acetaminophen 500 MG tablet ?Commonly known as: TYLENOL ?Take 500 mg by mouth every 8 (eight) hours as needed for mild pain. ?  ?amiodarone 200 MG tablet ?Commonly known  as: PACERONE ?Take 200 mg by mouth daily. ?  ?amLODipine 5 MG tablet ?Commonly known as: NORVASC ?Take 5 mg by mouth daily. ?  ?benzonatate 100 MG capsule ?Commonly known as: TESSALON ?Take 2 capsules (200 mg total) by mouth 3 (three) times daily as needed for cough. ?  ?cetirizine 10 MG tablet ?Commonly known as: ZYRTEC ?Take 10 mg by mouth daily. ?  ?estradiol 0.5 MG tablet ?Commonly known as: ESTRACE ?Take 0.5 mg by mouth daily. ?  ?metoprolol tartrate 50 MG tablet ?Commonly known as: LOPRESSOR ?Take 50 mg by mouth 2 (two) times daily. ?  ?nitrofurantoin (macrocrystal-monohydrate) 100 MG capsule ?Commonly known as: MACROBID ?Take 1 capsule (100 mg total) by mouth 2 (two) times daily for 5 days. ?Started by: Debroah Loop, PA-C ?  ?valsartan 160 MG tablet ?Commonly known as: DIOVAN ?Take 160 mg by mouth daily. ?  ? ?  ? ? ?Allergies:  ?Allergies  ?Allergen Reactions  ? Cefuroxime Other (See Comments)  ?  Patient states she has extreme abdominal pain and its not really effective for her.  ? Ace Inhibitors Itching  ? Codeine Sulfate Itching  ?    ?  ? Glucosamine   ? Latex Itching and Other (See Comments)  ?  Gloves   ? Levofloxacin Other (See Comments)  ?  Gastritis.  Onset 08/03/1999.  ? Shellfish-Derived Products Other (See Comments)  ?  Unknown, showed up on allergy test  ? Sulfa Antibiotics Itching  ? ? ?Family History: ?Family History  ?Problem Relation Age of Onset  ? Breast cancer Daughter 79  ? ? ?Social History:  ? reports that she has never smoked. She has never used smokeless tobacco. She reports that she does not drink alcohol and does not use drugs. ? ?Physical Exam: ?BP (!) 150/83   Pulse 71   Ht '5\' 3"'$  (1.6 m)   Wt 214 lb (97.1 kg)   BMI 37.91 kg/m?   ?Constitutional:  Alert and oriented, no acute distress, nontoxic appearing ?HEENT: [place], AT ?Cardiovascular: No clubbing, cyanosis, or edema ?Respiratory: Normal respiratory effort, no increased work of breathing ?Skin: No rashes, bruises or  suspicious lesions ?Neurologic: Grossly intact, no focal deficits, moving all 4 extremities ?Psychiatric: Normal mood and affect ? ?Laboratory Data: ?Results for orders placed or performed in visit on 09/30/21  ?Microscopic Examination  ? Urine  ?Result Value Ref Range  ? WBC, UA >30 (H) 0 - 5 /hpf  ? RBC 0-2 0 - 2 /hpf  ? Epithelial Cells (non renal) 0-10 0 - 10 /hpf  ? Casts Present (A) None seen /lpf  ? Cast Type Hyaline casts N/A  ? Bacteria, UA Many (A) None seen/Few  ?Urinalysis,  Complete  ?Result Value Ref Range  ? Specific Gravity, UA >1.030 (H) 1.005 - 1.030  ? pH, UA 5.5 5.0 - 7.5  ? Color, UA Yellow Yellow  ? Appearance Ur Clear Clear  ? Leukocytes,UA 1+ (A) Negative  ? Protein,UA 1+ (A) Negative/Trace  ? Glucose, UA Negative Negative  ? Ketones, UA Negative Negative  ? RBC, UA Negative Negative  ? Bilirubin, UA Negative Negative  ? Urobilinogen, Ur 0.2 0.2 - 1.0 mg/dL  ? Nitrite, UA Positive (A) Negative  ? Microscopic Examination See below:   ?Bladder Scan (Post Void Residual) in office  ?Result Value Ref Range  ? Scan Result 0   ? ?Assessment & Plan:   ?1. Dysuria ?UA grossly infected today, low concern for stone episode in the absence of flank pain and microscopic hematuria.  Will start empiric Macrobid and send for culture for further evaluation. ?- Urinalysis, Complete ?- CULTURE, URINE COMPREHENSIVE ?- Bladder Scan (Post Void Residual) in office ?- nitrofurantoin, macrocrystal-monohydrate, (MACROBID) 100 MG capsule; Take 1 capsule (100 mg total) by mouth 2 (two) times daily for 5 days.  Dispense: 10 capsule; Refill: 0 ? ?Return if symptoms worsen or fail to improve. ? ?Debroah Loop, PA-C ? ?[facility] ?[address], [address] ?[place], [place] [address] ?[phone] ?   ?

## 2021-10-04 ENCOUNTER — Telehealth: Payer: Self-pay

## 2021-10-04 LAB — CULTURE, URINE COMPREHENSIVE

## 2021-10-04 NOTE — Telephone Encounter (Signed)
-----   Message from Debroah Loop, [place] sent at 10/04/2021 10:45 AM EDT ----- ?Her urine culture grew multiple bacteria. Can you please call her and check her symptoms? If she's still symptomatic, recommend repeating a UA and culture. ?----- Message ----- ?From: Interface, [facility] Lab Results In ?Sent: 09/30/2021  11:36 AM EDT ?To: Debroah Loop, PA-C ? ? ?

## 2021-10-04 NOTE — Telephone Encounter (Signed)
Called pt, questioned if she has any uti symptoms currently. Pt denies any symptoms. States that dysuria has gotten better as well as urgency and frequency. Advised pt to call back if symptoms worsen. Pt voiced understanding.  ?

## 2021-10-27 ENCOUNTER — Encounter: Payer: Self-pay | Admitting: Physician Assistant

## 2021-10-27 ENCOUNTER — Ambulatory Visit
Admission: RE | Admit: 2021-10-27 | Discharge: 2021-10-27 | Disposition: A | Discharge status: Home / Self Care | Payer: Medicare Other | Attending: Physician Assistant | Admitting: Physician Assistant

## 2021-10-27 ENCOUNTER — Ambulatory Visit: Payer: Medicare Other | Admitting: Physician Assistant

## 2021-10-27 ENCOUNTER — Ambulatory Visit
Admission: RE | Admit: 2021-10-27 | Discharge: 2021-10-27 | Disposition: A | Discharge status: Home / Self Care | Payer: Medicare Other | Source: Ambulatory Visit | Attending: Physician Assistant | Admitting: Physician Assistant

## 2021-10-27 VITALS — BP 165/76 | HR 79 | Ht 63.0 in | Wt 221.0 lb

## 2021-10-27 DIAGNOSIS — N200 Calculus of kidney: Secondary | ICD-10-CM | POA: Diagnosis not present

## 2021-10-27 DIAGNOSIS — N390 Urinary tract infection, site not specified: Secondary | ICD-10-CM

## 2021-10-27 LAB — MICROSCOPIC EXAMINATION: WBC, UA: >30 /hpf — AB (ref 0–5)

## 2021-10-27 LAB — URINALYSIS, COMPLETE
Bilirubin, UA: NEGATIVE
Glucose, UA: NEGATIVE
Ketones, UA: NEGATIVE
Nitrite, UA: POSITIVE — AB
Specific Gravity, UA: 1.015 (ref 1.005–1.030)
Urobilinogen, Ur: 0.2 mg/dL (ref 0.2–1.0)
pH, UA: 6.5 (ref 5.0–7.5)

## 2021-10-27 MED ORDER — CEPHALEXIN 500 MG PO CAPS: 500.0000 mg | ORAL_CAPSULE | Freq: Three times a day (TID) | ORAL | 0 refills | Status: AC

## 2021-10-27 NOTE — Progress Notes (Signed)
? ?10/27/2021 ?10:32 AM  ? ?Emily Velazquez ?04-06-1951 ?[id number] ? ?CC: ?Chief Complaint  ?Patient presents with  ? Urinary Tract Infection  ? ?HPI: ?Emily Velazquez is a 71 y.o. female with PMH nephrolithiasis who presents today for evaluation of possible UTI. ? ?I saw her in clinic most recently about 1 month ago for the same.  I treated her with empiric Macrobid x5 days, however her urine culture finalized with multiple species. ? ?Today she reports her urinary symptoms improved on Macrobid, but never fully resolved.  She denies fever, chills, nausea, vomiting.  She reports ongoing lower abdominal and low back pain. ? ?In-office UA today positive for trace intact blood, trace protein, nitrites, and 3+ leukocyte esterase; urine microscopy with >30 WBCs/HPF and many bacteria.  ? ?PMH: ?Past Medical History:  ?Diagnosis Date  ? Anginal pain ([place])   ? Arthritis   ? Atrial fibrillation ([place])   ? Basal cell carcinoma   ? Edema   ? Fatty liver   ? history  ? Gout   ? Graves disease   ? Heart murmur   ? History of atrioventricular nodal ablation   ? History of kidney stones   ? Hypertension   ? IBS (irritable bowel syndrome)   ? Kidney stones   ? Low vitamin B12 level   ? Mitral regurgitation   ? OAB (overactive bladder)   ? PAF (paroxysmal atrial fibrillation) ([place])   ? Rosacea   ? Short gut syndrome   ? Vocal cord dysfunction   ? ? ?Surgical History: ?Past Surgical History:  ?Procedure Laterality Date  ? APPENDECTOMY    ? CARDIOVERSION N/A 05/23/2017  ? Procedure: CARDIOVERSION;  Surgeon: [name], MD;  Location: [facility];  Service: Cardiovascular;  Laterality: N/A;  ? CARDIOVERSION N/A 06/22/2017  ? Procedure: CARDIOVERSION;  Surgeon: [name], MD;  Location: [facility];  Service: Cardiovascular;  Laterality: N/A;  ? CATARACT EXTRACTION W/PHACO Right 10/26/2016  ? Procedure: CATARACT EXTRACTION PHACO AND INTRAOCULAR LENS PLACEMENT ([place]) suture placed in right eye at end of procedure;  Surgeon: [name], MD;  Location: [facility];  Service: Ophthalmology;  Laterality: Right;  [place]  01:21 ?AP% 23.7 ?CDE 36.24 ?Fluid pack lot # [id number]  ? COLON SURGERY    ? RESECTION 2 ft sm/ 2 large intestine  ? COLONOSCOPY WITH PROPOFOL N/A 03/18/2021  ? Procedure: COLONOSCOPY WITH PROPOFOL;  Surgeon: Annamaria Helling, DO;  Location: [facility]COPY;  Service: Gastroenterology;  Laterality: N/A;  ? DILATION AND CURETTAGE OF UTERUS    ? IR NEPHROSTOMY PLACEMENT RIGHT  01/17/2018  ? IR NEPHROSTOMY PLACEMENT RIGHT  02/27/2018  ? JOINT REPLACEMENT    ? BIL TKR  ? NEPHROLITHOTOMY Right 02/28/2018  ? Procedure: NEPHROLITHOTOMY PERCUTANEOUS;  Surgeon: Hollice Espy, MD;  Location: [facility];  Service: Urology;  Laterality: Right;  ? REPLACEMENT TOTAL KNEE BILATERAL    ? TEE WITHOUT CARDIOVERSION N/A 08/24/2017  ? Procedure: TRANSESOPHAGEAL ECHOCARDIOGRAM (TEE);  Surgeon: [name], MD;  Location: [facility];  Service: Cardiovascular;  Laterality: N/A;  ? [place]  ? complete  ? ? ?Home Medications:  ?Allergies as of 10/27/2021   ? ?   Reactions  ? Cefuroxime Other (See Comments)  ? Patient states she has extreme abdominal pain and its not really effective for her.  ? Ace Inhibitors Itching  ? Codeine Sulfate Itching  ?    ? Glucosamine   ? Latex Itching, Other (See Comments)  ?  Gloves   ? Levofloxacin Other (See Comments)  ? Gastritis.  Onset 08/03/1999.  ? Shellfish-derived Products Other (See Comments)  ? Unknown, showed up on allergy test  ? Sulfa Antibiotics Itching  ? ?  ? ?  ?Medication List  ?  ? ?  ? Accurate as of Oct 27, 2021 10:32 AM. If you have any questions, ask your nurse or doctor.  ?  ?  ? ?  ? ?STOP taking these medications   ? ?benzonatate 100 MG capsule ?Commonly known as: TESSALON ?Stopped by: Debroah Loop, PA-C ?  ? ?  ? ?TAKE these medications   ? ?acetaminophen 500 MG tablet ?Commonly known as: TYLENOL ?Take 500 mg by mouth every 8 (eight) hours as needed for mild pain. ?   ?amiodarone 200 MG tablet ?Commonly known as: PACERONE ?Take 200 mg by mouth daily. ?  ?amLODipine 10 MG tablet ?Commonly known as: NORVASC ?Take 10 mg by mouth daily. ?What changed: Another medication with the same name was removed. Continue taking this medication, and follow the directions you see here. ?Changed by: Debroah Loop, PA-C ?  ?cephALEXin 500 MG capsule ?Commonly known as: Keflex ?Take 1 capsule (500 mg total) by mouth 3 (three) times daily for 7 days. ?Started by: Debroah Loop, PA-C ?  ?cetirizine 10 MG tablet ?Commonly known as: ZYRTEC ?Take 10 mg by mouth daily. ?  ?estradiol 0.5 MG tablet ?Commonly known as: ESTRACE ?Take 0.5 mg by mouth daily. ?  ?metoprolol tartrate 50 MG tablet ?Commonly known as: LOPRESSOR ?Take 50 mg by mouth 2 (two) times daily. ?  ?valsartan 320 MG tablet ?Commonly known as: DIOVAN ?Take 320 mg by mouth daily. ?What changed: Another medication with the same name was removed. Continue taking this medication, and follow the directions you see here. ?Changed by: Debroah Loop, PA-C ?  ? ?  ? ? ?Allergies:  ?Allergies  ?Allergen Reactions  ? Cefuroxime Other (See Comments)  ?  Patient states she has extreme abdominal pain and its not really effective for her.  ? Ace Inhibitors Itching  ? Codeine Sulfate Itching  ?    ?  ? Glucosamine   ? Latex Itching and Other (See Comments)  ?  Gloves   ? Levofloxacin Other (See Comments)  ?  Gastritis.  Onset 08/03/1999.  ? Shellfish-Derived Products Other (See Comments)  ?  Unknown, showed up on allergy test  ? Sulfa Antibiotics Itching  ? ? ?Family History: ?Family History  ?Problem Relation Age of Onset  ? Breast cancer Daughter 37  ? ? ?Social History:  ? reports that she has never smoked. She has never used smokeless tobacco. She reports that she does not drink alcohol and does not use drugs. ? ?Physical Exam: ?BP (!) 165/76   Pulse 79   Ht '5\' 3"'$  (1.6 m)   Wt 221 lb (100.2 kg)   BMI 39.15 kg/m?    ?Constitutional:  Alert and oriented, no acute distress, nontoxic appearing ?HEENT: [place], AT ?Cardiovascular: No clubbing, cyanosis, or edema ?Respiratory: Normal respiratory effort, no increased work of breathing ?Skin: No rashes, bruises or suspicious lesions ?Neurologic: Grossly intact, no focal deficits, moving all 4 extremities ?Psychiatric: Normal mood and affect ? ?Laboratory Data: ?Results for orders placed or performed in visit on 10/27/21  ?Microscopic Examination  ? Urine  ?Result Value Ref Range  ? WBC, UA >30 (A) 0 - 5 /hpf  ? RBC 0-2 0 - 2 /hpf  ? Epithelial Cells (non renal) 0-10 0 -  10 /hpf  ? Bacteria, UA Many (A) None seen/Few  ?Urinalysis, Complete  ?Result Value Ref Range  ? Specific Gravity, UA 1.015 1.005 - 1.030  ? pH, UA 6.5 5.0 - 7.5  ? Color, UA Yellow Yellow  ? Appearance Ur Hazy (A) Clear  ? Leukocytes,UA 3+ (A) Negative  ? Protein,UA Trace (A) Negative/Trace  ? Glucose, UA Negative Negative  ? Ketones, UA Negative Negative  ? RBC, UA Trace (A) Negative  ? Bilirubin, UA Negative Negative  ? Urobilinogen, Ur 0.2 0.2 - 1.0 mg/dL  ? Nitrite, UA Positive (A) Negative  ? Microscopic Examination See below:   ? ?Assessment & Plan:   ?1. Recurrent UTI ?UA again grossly infected today and clinical presentation is most consistent with persistent UTI given multiple species grown on her most recent urine culture.  We will start her on empiric Keflex and repeat a urine culture today.  We discussed that if her culture again grows multiple species, I would like to bring her in for a lab visit UA upon completion of antibiotics to prove clearance of her infection.  She is in agreement with this plan. ?- Urinalysis, Complete ?- CULTURE, URINE COMPREHENSIVE ?- cephALEXin (KEFLEX) 500 MG capsule; Take 1 capsule (500 mg total) by mouth 3 (three) times daily for 7 days.  Dispense: 21 capsule; Refill: 0 ? ?2. Right kidney stone ?I will also obtain a KUB today to assess her stone burden as her last imaging was  approximately 2 years ago. ?- DG Abd 1 View; Future ? ?Return for Will call with results. ? ?Debroah Loop, PA-C ? ?[facility] ?[address], [address] ?[place], [place] [address] ?[phone]

## 2021-10-28 ENCOUNTER — Other Ambulatory Visit: Payer: Self-pay | Admitting: Physician Assistant

## 2021-10-28 DIAGNOSIS — N390 Urinary tract infection, site not specified: Secondary | ICD-10-CM

## 2021-10-28 DIAGNOSIS — N200 Calculus of kidney: Secondary | ICD-10-CM

## 2021-10-30 LAB — CULTURE, URINE COMPREHENSIVE

## 2021-11-05 ENCOUNTER — Encounter: Payer: Self-pay | Admitting: Physician Assistant

## 2021-11-05 ENCOUNTER — Ambulatory Visit: Payer: Medicare Other | Admitting: Physician Assistant

## 2021-11-05 VITALS — BP 147/77 | HR 75 | Ht 63.0 in | Wt 214.0 lb

## 2021-11-05 DIAGNOSIS — N390 Urinary tract infection, site not specified: Secondary | ICD-10-CM

## 2021-11-05 LAB — URINALYSIS, COMPLETE
Bilirubin, UA: NEGATIVE
Glucose, UA: NEGATIVE
Ketones, UA: NEGATIVE
Nitrite, UA: NEGATIVE
Protein,UA: NEGATIVE
Specific Gravity, UA: <1.005 — ABNORMAL LOW (ref 1.005–1.030)
Urobilinogen, Ur: 0.2 mg/dL (ref 0.2–1.0)
pH, UA: 5.5 (ref 5.0–7.5)

## 2021-11-05 LAB — MICROSCOPIC EXAMINATION

## 2021-11-05 NOTE — Progress Notes (Signed)
In and Out Catheterization ? ?Patient is present today for a I & O catheterization due to burning . Patient was cleaned and prepped in a sterile fashion with betadine . A 14FR cath was inserted no complications were noted , 26m of urine return was noted, urine was light yellow in color. A clean urine sample was collected for ua. Bladder was drained  And catheter was removed with out difficulty.   ? ?Performed by: JGaspar ColaCMA ? ?Additional notes: Urine sent for culture again today to prove resolution of her infection after multiple species grew on culture twice. ? ?SDebroah Loop PA-C ?11/05/21 ?2:39 PM ? ?Return for Will call with results.  ? ? ?

## 2021-11-09 LAB — CULTURE, URINE COMPREHENSIVE

## 2021-11-11 ENCOUNTER — Ambulatory Visit
Admission: RE | Admit: 2021-11-11 | Discharge: 2021-11-11 | Disposition: A | Discharge status: Home / Self Care | Payer: Medicare Other | Source: Ambulatory Visit | Attending: Physician Assistant | Admitting: Physician Assistant

## 2021-11-11 DIAGNOSIS — N390 Urinary tract infection, site not specified: Secondary | ICD-10-CM | POA: Insufficient documentation

## 2021-11-11 DIAGNOSIS — N200 Calculus of kidney: Secondary | ICD-10-CM | POA: Diagnosis present

## 2021-11-18 ENCOUNTER — Telehealth: Payer: Self-pay

## 2021-11-18 NOTE — Progress Notes (Signed)
Can you please call her and check on her symptoms? Urine culture is not growing multiple species anymore, but she does have a very small amount of E coli in her urine that may not require treatment if asymptomatic.  I have also reviewed her CT scan. I don't see any abnormalities that would be causing her recurrent polymicrobial UTIs, however she does have multiple nonobstructing right renal stones. Her overall stone burden on this side is significant, and treatment options would include observation versus elective ureteroscopy. If she is interested in discussing stone management procedures, please bring her in on my schedule to discuss.

## 2021-11-18 NOTE — Telephone Encounter (Signed)
Called pt, no answer. 1st attempt.  

## 2021-11-18 NOTE — Telephone Encounter (Signed)
-----   Message from Debroah Loop, [place] sent at 11/18/2021 12:57 PM EDT ----- Can you please call her and check on her symptoms? Urine culture is not growing multiple species anymore, but she does have a very small amount of E coli in her urine that may not require treatment if asymptomatic.  I have also reviewed her CT scan. I don't see any abnormalities that would be causing her recurrent polymicrobial UTIs, however she does have multiple nonobstructing right renal stones. Her overall stone burden on this side is significant, and treatment options would include observation versus elective ureteroscopy. If she is interested in discussing stone management procedures, please bring her in on my schedule to discuss.

## 2021-11-24 ENCOUNTER — Encounter: Payer: Self-pay | Admitting: *Deleted

## 2021-11-24 NOTE — Telephone Encounter (Signed)
Pt is screening calls, left message on VM, will send mychart due to multiple attempts.

## 2021-11-25 NOTE — Telephone Encounter (Signed)
Pt called office to schedule appt to go over CT results

## 2021-11-29 ENCOUNTER — Ambulatory Visit: Payer: Medicare Other | Admitting: Physician Assistant

## 2021-11-29 VITALS — BP 160/77 | HR 72 | Ht 63.0 in | Wt 220.0 lb

## 2021-11-29 DIAGNOSIS — N390 Urinary tract infection, site not specified: Secondary | ICD-10-CM

## 2021-11-29 DIAGNOSIS — N200 Calculus of kidney: Secondary | ICD-10-CM | POA: Diagnosis not present

## 2021-11-29 DIAGNOSIS — Z87440 Personal history of urinary (tract) infections: Secondary | ICD-10-CM | POA: Diagnosis not present

## 2021-11-29 LAB — URINALYSIS, COMPLETE
Bilirubin, UA: NEGATIVE
Glucose, UA: NEGATIVE
Ketones, UA: NEGATIVE
Nitrite, UA: NEGATIVE
Protein,UA: NEGATIVE
Specific Gravity, UA: 1.025 (ref 1.005–1.030)
Urobilinogen, Ur: 0.2 mg/dL (ref 0.2–1.0)
pH, UA: 5.5 (ref 5.0–7.5)

## 2021-11-29 LAB — MICROSCOPIC EXAMINATION

## 2021-11-30 ENCOUNTER — Other Ambulatory Visit: Payer: Self-pay | Admitting: Physician Assistant

## 2021-11-30 DIAGNOSIS — N200 Calculus of kidney: Secondary | ICD-10-CM

## 2021-11-30 NOTE — Progress Notes (Signed)
11/29/2021 4:12 PM   Emily Velazquez [birth year] [id number]  CC: Chief Complaint  Patient presents with   Results   HPI: Emily Velazquez is a 71 y.o. female with PMH nephrolithiasis and multiple recent positive urine cultures with multiple species who presents today to discuss definitive stone management.   She underwent right PCNL with Dr. Erlene Quan in 2019.  As part of her work-up for her recent recurrent versus persistent polymicrobial UTIs, I obtain a CT stone study, which revealed multiple nonobstructing right renal stones measuring up to 12 mm.  Today she reports no dysuria or lower abdominal pain consistent with her recent infections.  She is interested in pursuing proactive stone management for her right renal stones.  In-office UA today positive for trace lysed blood and 1+ leukocyte esterase; urine microscopy with 11-30 WBCs/HPF, 3-10 RBCs/HPF, and moderate bacteria.   PMH: Past Medical History:  Diagnosis Date   Anginal pain ([place])    Arthritis    Atrial fibrillation ([place])    Basal cell carcinoma    Edema    Fatty liver    history   Gout    Graves disease    Heart murmur    History of atrioventricular nodal ablation    History of kidney stones    Hypertension    IBS (irritable bowel syndrome)    Kidney stones    Low vitamin B12 level    Mitral regurgitation    OAB (overactive bladder)    PAF (paroxysmal atrial fibrillation) (HCC)    Rosacea    Short gut syndrome    Vocal cord dysfunction     Surgical History: Past Surgical History:  Procedure Laterality Date   APPENDECTOMY     CARDIOVERSION N/A 05/23/2017   Procedure: CARDIOVERSION;  Surgeon: [name], MD;  Location: [facility];  Service: Cardiovascular;  Laterality: N/A;   CARDIOVERSION N/A 06/22/2017   Procedure: CARDIOVERSION;  Surgeon: [name], MD;  Location: [facility];  Service: Cardiovascular;  Laterality: N/A;   CATARACT EXTRACTION W/PHACO Right 10/26/2016   Procedure: CATARACT  EXTRACTION PHACO AND INTRAOCULAR LENS PLACEMENT ([place]) suture placed in right eye at end of procedure;  Surgeon: [name], MD;  Location: [facility];  Service: Ophthalmology;  Laterality: Right;  [place]  01:21 AP% 23.7 CDE 36.24 Fluid pack lot # [id number]   COLON SURGERY     RESECTION 2 ft sm/ 2 large intestine   COLONOSCOPY WITH PROPOFOL N/A 03/18/2021   Procedure: COLONOSCOPY WITH PROPOFOL;  Surgeon: Annamaria Helling, DO;  Location: [facility];  Service: Gastroenterology;  Laterality: N/A;   DILATION AND CURETTAGE OF UTERUS     IR NEPHROSTOMY PLACEMENT RIGHT  01/17/2018   IR NEPHROSTOMY PLACEMENT RIGHT  02/27/2018   JOINT REPLACEMENT     BIL TKR   NEPHROLITHOTOMY Right 02/28/2018   Procedure: NEPHROLITHOTOMY PERCUTANEOUS;  Surgeon: Hollice Espy, MD;  Location: [facility];  Service: Urology;  Laterality: Right;   REPLACEMENT TOTAL KNEE BILATERAL     TEE WITHOUT CARDIOVERSION N/A 08/24/2017   Procedure: TRANSESOPHAGEAL ECHOCARDIOGRAM (TEE);  Surgeon: [name], MD;  Location: [facility];  Service: Cardiovascular;  Laterality: N/A;   VAGINAL HYSTERECTOMY  1997   complete    Home Medications:  Allergies as of 11/29/2021       Reactions   Cefuroxime Other (See Comments)   Patient states she has extreme abdominal pain and its not really effective for her.   Ace Inhibitors Itching   Codeine Sulfate Itching  Glucosamine    Latex Itching, Other (See Comments)   Gloves    Levofloxacin Other (See Comments)   Gastritis.  Onset 08/03/1999.   Shellfish-derived Products Other (See Comments)   Unknown, showed up on allergy test   Sulfa Antibiotics Itching        Medication List        Accurate as of November 29, 2021 11:59 PM. If you have any questions, ask your nurse or doctor.          acetaminophen 500 MG tablet Commonly known as: TYLENOL Take 500 mg by mouth every 8 (eight) hours as needed for mild pain.   amiodarone 200 MG tablet Commonly known as:  PACERONE Take 200 mg by mouth daily.   amLODipine 10 MG tablet Commonly known as: NORVASC Take 10 mg by mouth daily.   cetirizine 10 MG tablet Commonly known as: ZYRTEC Take 10 mg by mouth daily.   estradiol 0.5 MG tablet Commonly known as: ESTRACE Take 0.5 mg by mouth daily.   metoprolol tartrate 50 MG tablet Commonly known as: LOPRESSOR Take 50 mg by mouth 2 (two) times daily.   valsartan 320 MG tablet Commonly known as: DIOVAN Take 320 mg by mouth daily.        Allergies:  Allergies  Allergen Reactions   Cefuroxime Other (See Comments)    Patient states she has extreme abdominal pain and its not really effective for her.   Ace Inhibitors Itching   Codeine Sulfate Itching         Glucosamine    Latex Itching and Other (See Comments)    Gloves    Levofloxacin Other (See Comments)    Gastritis.  Onset 08/03/1999.   Shellfish-Derived Products Other (See Comments)    Unknown, showed up on allergy test   Sulfa Antibiotics Itching    Family History: Family History  Problem Relation Age of Onset   Breast cancer Daughter 13    Social History:   reports that she has never smoked. She has never used smokeless tobacco. She reports that she does not drink alcohol and does not use drugs.  Physical Exam: BP (!) 160/77   Pulse 72   Ht '5\' 3"'$  (1.6 m)   Wt 220 lb (99.8 kg)   BMI 38.97 kg/m   Constitutional:  Alert and oriented, no acute distress, nontoxic appearing HEENT: [place], AT Cardiovascular: No clubbing, cyanosis, or edema Respiratory: Normal respiratory effort, no increased work of breathing Skin: No rashes, bruises or suspicious lesions Neurologic: Grossly intact, no focal deficits, moving all 4 extremities Psychiatric: Normal mood and affect  Laboratory Data: Results for orders placed or performed in visit on 11/29/21  Microscopic Examination   Urine  Result Value Ref Range   WBC, UA 11-30 (A) 0 - 5 /hpf   RBC 3-10 (A) 0 - 2 /hpf   Epithelial Cells  (non renal) 0-10 0 - 10 /hpf   Bacteria, UA Moderate (A) None seen/Few  Urinalysis, Complete  Result Value Ref Range   Specific Gravity, UA 1.025 1.005 - 1.030   pH, UA 5.5 5.0 - 7.5   Color, UA Yellow Yellow   Appearance Ur Hazy (A) Clear   Leukocytes,UA 1+ (A) Negative   Protein,UA Negative Negative/Trace   Glucose, UA Negative Negative   Ketones, UA Negative Negative   RBC, UA Trace (A) Negative   Bilirubin, UA Negative Negative   Urobilinogen, Ur 0.2 0.2 - 1.0 mg/dL   Nitrite, UA Negative Negative  Microscopic Examination See below:    Pertinent Imaging: Results for orders placed during the hospital encounter of 11/11/21  CT RENAL STONE STUDY  Narrative CLINICAL DATA:  History of nephrolithiasis and possible UTI  EXAM: CT ABDOMEN AND PELVIS WITHOUT CONTRAST  TECHNIQUE: Multidetector CT imaging of the abdomen and pelvis was performed following the standard protocol without IV contrast.  RADIATION DOSE REDUCTION: This exam was performed according to the departmental dose-optimization program which includes automated exposure control, adjustment of the mA and/or kV according to patient size and/or use of iterative reconstruction technique.  COMPARISON:  01/17/2018  FINDINGS: Lower chest: No acute abnormality.  Hepatobiliary: Gallbladder is within normal limits. Liver demonstrates a few capsular calcifications stable from the prior exam.  Pancreas: Unremarkable. No pancreatic ductal dilatation or surrounding inflammatory changes.  Spleen: Normal in size without focal abnormality.  Adrenals/Urinary Tract: Adrenal glands are within normal limits. Left kidney demonstrates no renal calculi or obstructive changes. Right kidney demonstrates multiple renal stones the largest of which measures 12 mm in dimension. The collecting system and ureter are within normal limits bilaterally. The bladder is decompressed.  Stomach/Bowel: No obstructive or inflammatory changes  of the colon are seen. Partial colectomy on the right has been performed. The anastomosis is widely patent. The appendix has been surgically removed. Small bowel shows no obstructive changes. The stomach is within normal limits.  Vascular/Lymphatic: Aortic atherosclerosis. No enlarged abdominal or pelvic lymph nodes.  Reproductive: Status post hysterectomy. No adnexal masses.  Other: No abdominal wall hernia or abnormality. No abdominopelvic ascites.  Musculoskeletal: Mild degenerative changes of lumbar spine are noted.  IMPRESSION: Nonobstructing right renal stones.  Status post partial colectomy with patent anastomosis.  No acute abnormality is noted.   Electronically Signed By: Inez Catalina M.D. On: 11/12/2021 02:57  I personally reviewed the images referenced above and note multiple nonobstructing right renal stones.  Assessment & Plan:   1. Right kidney stone Progressive increase in her right renal stone burden since undergoing right PCNL in 2019.  We discussed various treatment options for her stones including surveillance e vs. ESWL vs. ureteroscopy with laser lithotripsy and stent. We discussed the risks and benefits of all procedures including bleeding, infection, and damage to surrounding structures.  We specifically discussed that ESWL is a less invasive procedure that requires less anesthesia, however patients have to pass their residual stone fragments, which may take several weeks and be associated with continued renal colic. Additionally, we discussed the limitations of ESWL including the ability to only treat one stone in a single treatment and the low, 10-20% chance of treatment failure requiring repeat ESWL versus ureteroscopy in the future. By comparison, ureteroscopy is a more invasive surgical procedure that requires more anesthesia, but we can potentially treat multiple stones in a single procedure. However, URS does require placement of a ureteral stent,  which will remain in place for approximately 3-10 days and can be associated with flank pain, bladder pain, dysuria, urgency, frequency, urinary leakage, and gross hematuria.  Based on this conversation, she would like to proceed with elective right ureteroscopy with laser lithotripsy and stent placement.   2. Recurrent UTI Not clinically infected today.  We will send urine for culture, but defer treatment unless she develops symptoms.  We also discussed that she will likely need antibiotics in the preoperative period to sterilize the urine. - Urinalysis, Complete - CULTURE, URINE COMPREHENSIVE   Return for Will call to schedule surgery.  Debroah Loop, PA-C  [facility] [address] [place], [place] [address] [phone]

## 2021-11-30 NOTE — Progress Notes (Signed)
Surgical Physician [facility]  Dr. Erlene Quan * Scheduling expectation : Next Available  *Length of Case:   *Clearance needed: no  *Anticoagulation Instructions: N/A  *Aspirin Instructions: N/A  *Post-op visit Date/Instructions:   1 month with KUB prior  *Diagnosis: Right Nephrolithiasis  *Procedure: right Ureteroscopy w/laser lithotripsy & stent placement (99833)   Additional orders: N/A  -Admit type: OUTpatient  -Anesthesia: General  -VTE Prophylaxis Standing Order SCD's       Other:   -Standing Lab Orders Per Anesthesia    Lab other: UA&Urine Culture  -Standing Test orders EKG/Chest x-ray per Anesthesia       Test other:   - Medications:  Ancef 2gm IV  -Other orders:  N/A

## 2021-12-03 ENCOUNTER — Telehealth: Payer: Self-pay

## 2021-12-03 NOTE — Progress Notes (Signed)
[facility] Posting Form   Surgery Date/Time: Date: 01/03/2022  Surgeon: Dr. Hollice Espy, MD  Surgery Location: Day Surgery  Inpt ( No  )   Outpt (Yes)   Obs ( No  )   Diagnosis: N20.0 Right Nephrolithasis  -CPT: 52356  Surgery: Right Ureteroscopy with laser lithotripsy and stent placement  Stop Anticoagulations: N/A  Cardiac/Medical/Pulmonary Clearance needed: no  *Orders entered into EPIC  Date: 12/03/21   *Case booked in EPIC  Date: 12/02/2021  *Notified pt of Surgery: Date: 12/02/2021  PRE-OP UA & CX: Yes, will obtain in clinic on 12/14/2021  *Placed into Prior Authorization Work Fabio Bering Date: 12/03/21   Assistant/laser/rep:No

## 2021-12-03 NOTE — Telephone Encounter (Signed)
I spoke with Emily Velazquez. We have discussed possible surgery dates and Monday July 10th, 2023 was agreed upon by all parties. Patient given information about surgery date, what to expect pre-operatively and post operatively.  We discussed that a Pre-Admission Testing office will be calling to set up the pre-op visit that will take place prior to surgery, and that these appointments are typically done over the phone with a Pre-Admissions RN.  Informed patient that our office will communicate any additional care to be provided after surgery. Patients questions or concerns were discussed during our call. Advised to call our office should there be any additional information, questions or concerns that arise. Patient verbalized understanding.

## 2021-12-04 LAB — CULTURE, URINE COMPREHENSIVE

## 2021-12-14 ENCOUNTER — Other Ambulatory Visit: Payer: Medicare Other

## 2021-12-14 DIAGNOSIS — N200 Calculus of kidney: Secondary | ICD-10-CM

## 2021-12-14 LAB — URINALYSIS, COMPLETE
Bilirubin, UA: NEGATIVE
Glucose, UA: NEGATIVE
Ketones, UA: NEGATIVE
Nitrite, UA: POSITIVE — AB
Protein,UA: NEGATIVE
Specific Gravity, UA: 1.02 (ref 1.005–1.030)
Urobilinogen, Ur: 0.2 mg/dL (ref 0.2–1.0)
pH, UA: 5.5 (ref 5.0–7.5)

## 2021-12-14 LAB — MICROSCOPIC EXAMINATION: WBC, UA: >30 /hpf — AB (ref 0–5)

## 2021-12-17 LAB — CULTURE, URINE COMPREHENSIVE

## 2021-12-20 ENCOUNTER — Telehealth: Payer: Self-pay | Admitting: *Deleted

## 2021-12-23 ENCOUNTER — Encounter
Admission: RE | Admit: 2021-12-23 | Discharge: 2021-12-23 | Disposition: A | Discharge status: Home / Self Care | Payer: Medicare Other | Source: Ambulatory Visit | Attending: Urology | Admitting: Urology

## 2021-12-23 ENCOUNTER — Other Ambulatory Visit: Payer: Self-pay

## 2021-12-23 DIAGNOSIS — N10 Acute tubulo-interstitial nephritis: Secondary | ICD-10-CM

## 2021-12-23 DIAGNOSIS — I10 Essential (primary) hypertension: Secondary | ICD-10-CM

## 2021-12-23 DIAGNOSIS — I480 Paroxysmal atrial fibrillation: Secondary | ICD-10-CM

## 2021-12-23 DIAGNOSIS — N179 Acute kidney failure, unspecified: Secondary | ICD-10-CM

## 2021-12-23 DIAGNOSIS — Z01812 Encounter for preprocedural laboratory examination: Secondary | ICD-10-CM

## 2021-12-23 HISTORY — DX: Prediabetes: R73.03

## 2021-12-23 HISTORY — DX: Pneumonia, unspecified organism: J18.9

## 2021-12-23 MED ORDER — CEPHALEXIN 500 MG PO CAPS: 500.0000 mg | ORAL_CAPSULE | Freq: Three times a day (TID) | ORAL | 0 refills | Status: AC

## 2021-12-23 NOTE — Patient Instructions (Addendum)
Your procedure is scheduled on: 01/03/22 - Monday Report to the Registration Desk on the 1st floor of the [facility]. To find out your arrival time, please call [phone] between 1PM - 3PM on: 12/31/21 - Friday If your arrival time is 6:00 am, do not arrive prior to that time as the [place] [facility] entrance doors do not open until 6:00 am.  REMEMBER: Instructions that are not followed completely may result in serious medical risk, up to and including death; or upon the discretion of your surgeon and anesthesiologist your surgery may need to be rescheduled.  Do not eat food or drink any fluids after midnight the night before surgery.  No gum chewing, lozengers or hard candies.  TAKE THESE MEDICATIONS THE MORNING OF SURGERY WITH A SIP OF WATER:  - amLODipine (NORVASC) 10 MG tablet - estradiol (ESTRACE) 0.5 MG tablet - metoprolol tartrate (LOPRESSOR) 50 MG tablet  One week prior to surgery: Stop Anti-inflammatories (NSAIDS) such as Advil, Aleve, Ibuprofen, Motrin, Naproxen, Naprosyn and Aspirin based products such as Excedrin, Goodys Powder, BC Powder.  Stop ANY OVER THE COUNTER supplements until after surgery.  You may take Tylenol if needed for pain up until the day of surgery.  No Alcohol for 24 hours before or after surgery.  No Smoking including e-cigarettes for 24 hours prior to surgery.  No chewable tobacco products for at least 6 hours prior to surgery.  No nicotine patches on the day of surgery.  Do not use any "recreational" drugs for at least a week prior to your surgery.  Please be advised that the combination of cocaine and anesthesia may have negative outcomes, up to and including death. If you test positive for cocaine, your surgery will be cancelled.  On the morning of surgery brush your teeth with toothpaste and water, you may rinse your mouth with mouthwash if you wish. Do not swallow any toothpaste or mouthwash.  Do not wear jewelry, make-up, hairpins, clips  or nail polish.  Do not wear lotions, powders, or perfumes.   Do not shave body from the neck down 48 hours prior to surgery just in case you cut yourself which could leave a site for infection.  Also, freshly shaved skin may become irritated if using the CHG soap.  Contact lenses, hearing aids and dentures may not be worn into surgery.  Do not bring valuables to the hospital. [facility] is not responsible for any missing/lost belongings or valuables.   Notify your doctor if there is any change in your medical condition (cold, fever, infection).  Wear comfortable clothing (specific to your surgery type) to the hospital.  After surgery, you can help prevent lung complications by doing breathing exercises.  Take deep breaths and cough every 1-2 hours. Your doctor may order a device called an Incentive Spirometer to help you take deep breaths. When coughing or sneezing, hold a pillow firmly against your incision with both hands. This is called "splinting." Doing this helps protect your incision. It also decreases belly discomfort.  If you are being admitted to the hospital overnight, leave your suitcase in the car. After surgery it may be brought to your room.  If you are being discharged the day of surgery, you will not be allowed to drive home. You will need a responsible adult (18 years or older) to drive you home and stay with you that night.   If you are taking public transportation, you will need to have a responsible adult (18 years or  older) with you. Please confirm with your physician that it is acceptable to use public transportation.   Please call the [facility] at [phone] if you have any questions about these instructions.  Surgery Visitation Policy:  Patients undergoing a surgery or procedure may have two family members or support persons with them as long as the person is not COVID-19 positive or experiencing its symptoms.   Inpatient Visitation:     Visiting hours are 7 a.m. to 8 p.m. Up to four visitors are allowed at one time in a patient room, including children. The visitors may rotate out with other people during the day. One designated support person (adult) may remain overnight.

## 2021-12-30 ENCOUNTER — Encounter
Admission: RE | Admit: 2021-12-30 | Discharge: 2021-12-30 | Disposition: A | Discharge status: Home / Self Care | Payer: Medicare Other | Source: Ambulatory Visit | Attending: Urology | Admitting: Urology

## 2021-12-30 ENCOUNTER — Encounter: Payer: Self-pay | Admitting: Urgent Care

## 2021-12-30 ENCOUNTER — Encounter: Payer: Self-pay | Admitting: Urology

## 2021-12-30 DIAGNOSIS — Z01810 Encounter for preprocedural cardiovascular examination: Secondary | ICD-10-CM | POA: Diagnosis not present

## 2021-12-30 DIAGNOSIS — Z01818 Encounter for other preprocedural examination: Secondary | ICD-10-CM | POA: Insufficient documentation

## 2021-12-30 DIAGNOSIS — I480 Paroxysmal atrial fibrillation: Secondary | ICD-10-CM | POA: Diagnosis not present

## 2021-12-30 DIAGNOSIS — Z01812 Encounter for preprocedural laboratory examination: Secondary | ICD-10-CM | POA: Diagnosis present

## 2021-12-30 DIAGNOSIS — N179 Acute kidney failure, unspecified: Secondary | ICD-10-CM | POA: Insufficient documentation

## 2021-12-30 DIAGNOSIS — N10 Acute tubulo-interstitial nephritis: Secondary | ICD-10-CM | POA: Diagnosis not present

## 2021-12-30 DIAGNOSIS — I10 Essential (primary) hypertension: Secondary | ICD-10-CM

## 2021-12-30 LAB — BASIC METABOLIC PANEL
Anion gap: 6 (ref 5–15)
BUN: 21 mg/dL (ref 8–23)
CO2: 25 mmol/L (ref 22–32)
Calcium: 8.9 mg/dL (ref 8.9–10.3)
Chloride: 107 mmol/L (ref 98–111)
Creatinine, Ser: 1.03 mg/dL — ABNORMAL HIGH (ref 0.44–1.00)
GFR, Estimated: 58 mL/min — ABNORMAL LOW (ref >60)
Glucose, Bld: 96 mg/dL (ref 70–99)
Potassium: 3.8 mmol/L (ref 3.5–5.1)
Sodium: 138 mmol/L (ref 135–145)

## 2021-12-30 LAB — CBC
HCT: 41.3 % (ref 36.0–46.0)
Hemoglobin: 13.6 g/dL (ref 12.0–15.0)
MCH: 29.7 pg (ref 26.0–34.0)
MCHC: 32.9 g/dL (ref 30.0–36.0)
MCV: 90.2 fL (ref 80.0–100.0)
Platelets: 214 10*3/uL (ref 150–400)
RBC: 4.58 MIL/uL (ref 3.87–5.11)
RDW: 12.9 % (ref 11.5–15.5)
WBC: 6 10*3/uL (ref 4.0–10.5)
nRBC: 0 % (ref 0.0–0.2)

## 2021-12-30 NOTE — Progress Notes (Signed)
Perioperative Services  Pre-Admission/Anesthesia Testing Clinical Review  Date: 12/30/21  Patient Demographics:  Name: Emily Velazquez DOB:   April 02, 1951 MRN:   [id number]  Planned Surgical Procedure(s):    Case: [id number] Date/Time: 01/03/22 0730   Procedure: CYSTOSCOPY/URETEROSCOPY/HOLMIUM LASER/STENT PLACEMENT (Right)   Anesthesia type: General   Pre-op diagnosis: Right Nephrolithiasis   Location: [facility] ROOM 10 / [place] [facility] FOR ANESTHESIA GROUP   Surgeons: Hollice Espy, MD   NOTE: Available PAT nursing documentation and vital signs have been reviewed. Clinical nursing staff has updated patient's PMH/PSHx, current medication list, and drug allergies/intolerances to ensure comprehensive history available to assist in medical decision making as it pertains to the aforementioned surgical procedure and anticipated anesthetic course. Extensive review of available clinical information performed. [place] PMH and PSHx updated with any diagnoses/procedures that  may have been inadvertently omitted during her intake with the pre-admission testing department's nursing staff.  Clinical Discussion:  Emily Velazquez is a 71 y.o. female who is submitted for pre-surgical anesthesia review and clearance prior to her undergoing the above procedure. Patient has never been a smoker. Pertinent PMH includes: PAF, angina, aortic atherosclerosis, cardiac murmur, bradycardia, valvular heart disease, HTN, prediabetes, Graves' disease (s/p Tx with PTU), CKD-III, vocal cord dysfunction, OA, nephrolithiasis.  Patient is followed by cardiology Nehemiah Massed, MD). She was last seen in the cardiology clinic on 09/10/2021; notes reviewed.  At the time of her clinic visit, patient doing well overall from a cardiovascular perspective.  She denied any episodes of chest pain, shortness breath, PND, orthopnea, palpitations, significant peripheral edema, fatigue, vertiginous symptoms, or presyncope/syncope.  Patient with a past  medical history significant for cardiovascular diagnoses.  Patient underwent DCCV procedure on 05/23/2017.  150 J cardioversions x 2 were delivered prior to patient converting to NSR.  Patient scheduled for second DCCV procedure on 06/22/2017.  Ultimately, procedure was canceled due to spontaneous conversion to NSR prior to procedure.  Patient with refractory atrial arrhythmia.  She underwent PVI ablation on 10/25/2017 converting to NSR.  Most recent TTE was performed on 07/19/2021 revealing a normal left trickle systolic function with an EF of >55%. There were no regional wall motion abnormalities. There was mild mitral, tricuspid, and pulmonary valve regurgitation. There was no evidence of a significant transvalvular gradient to suggest stenosis.  Patient with an atrial fibrillation diagnosis; CHA2DS2-VASc Score = 4 (age, sex, HTN, aortic plaque).patient has undergone the aforementioned procedures for her atrial fibrillation diagnosis with no noted recurrences since her PVI ablation.  Patient is rate and rhythm currently being maintained on oral amiodarone + metoprolol tartrate.  Of note, patient previously experiencing episodes of bradycardia during her last visit to the cardiology clinic.  Metoprolol dose was decreased, and patient has had no further recurrence and noted that she was feeling better overall.  At the time of her clinic visit, patient was on apixaban therapy, however due to complaints of recurrent epistaxis events, patient adamant about discontinuation of therapy.  Risk versus benefits discussed by provider, however patient remained adamant citing that she has had no recurrences of her dysrhythmia; apixaban discontinued by cardiology. Blood pressure mildly elevated at 140/84 on currently prescribed CCB, beta-blocker, and ARB therapies.  Patient not currently taking any type of lipid-lowering therapies for ASCVD prevention.  She has a prediabetes diagnosis.  Last hemoglobin A1c was stable  at 5.7% when checked on 09/30/2021. Functional capacity, as defined by DASI, is documented as being >/= 4 METS.  No other changes were made to her  medication regimen.  Patient to follow-up with outpatient cardiology in 9 months or sooner if needed.  Emily Velazquez is scheduled for a RIGHT CYSTOSCOPY/URETEROSCOPY/HOLMIUM LASER/STENT PLACEMENT on 01/03/2022 with Dr. Hollice Espy, MD.  Given patient's past medical history significant for cardiovascular diagnoses, presurgical cardiac clearance was sought by the PAT team. Per cardiology, "this patient is optimized for surgery and may proceed with the planned procedural course with a LOW risk of significant perioperative cardiovascular complications". In review of her medication reconciliation, the patient is not noted to be taking any type of anticoagulation or antiplatelet therapies that would need to be held during her perioperative course.  Patient denies previous perioperative complications with anesthesia in the past. In review of the available records, it is noted that patient underwent a general anesthetic course here at [facility] (ASA III) in 02/2021 without documented complications.      11/29/2021    3:13 PM 11/05/2021    2:03 PM 10/27/2021    9:57 AM  Vitals with BMI  Height '5\' 3"'$  '5\' 3"'$  '5\' 3"'$   Weight 220 lbs 214 lbs 221 lbs  BMI 38.98 82.99 37.16  Systolic 967 893 810  Diastolic 77 77 76  Pulse 72 75 79    Providers/Specialists:   NOTE: Primary physician provider listed below. Patient may have been seen by APP or partner within same practice.   PROVIDER ROLE / SPECIALTY LAST Lu Duffel, MD Urology (Surgeon) 11/29/2021  Tracie Harrier, MD Primary Care Provider 10/07/2021  Serafina Royals, MD Cardiology 09/10/2021   Allergies:  Cefuroxime, Ace inhibitors, Codeine sulfate, Glucosamine, Latex, Levofloxacin, Shellfish-derived products, and Sulfa antibiotics  Current Home Medications:   No  current facility-administered medications for this encounter.    acetaminophen (TYLENOL) 500 MG tablet   amiodarone (PACERONE) 200 MG tablet   amLODipine (NORVASC) 10 MG tablet   cetirizine (ZYRTEC) 10 MG tablet   estradiol (ESTRACE) 0.5 MG tablet   metoprolol tartrate (LOPRESSOR) 50 MG tablet   valsartan (DIOVAN) 320 MG tablet   History:   Past Medical History:  Diagnosis Date   Anginal pain (HCC)    Aortic atherosclerosis (HCC)    Arthritis    B12 deficiency    Basal cell carcinoma    Bradycardia    Cataract    a.) s/p extraction   CKD (chronic kidney disease), stage III (HCC)    Edema    Gout    Graves disease    a.) s/p remote Tx with PTU   Heart murmur    Hepatic steatosis    History of kidney stones    HLD (hyperlipidemia)    Hypertension    IBS (irritable bowel syndrome)    Kidney stones    Low vitamin B12 level    OAB (overactive bladder)    PAF (paroxysmal atrial fibrillation) ([place])    a.) CHA2DS2-VASc = 4 (age, sex, HTN, aortic plaque);  b.) s/p DCCV 05/23/2017 (150 J x 2); DCCV 06/22/2017 aborted d/t spontaneous conversion; PVI ablation 10/25/2017; c.) rate/rhythm maintained on oral amiodarone + metoprolol tartrate; no chronic anticoagulation   Pneumonia    Pre-diabetes    Rosacea    Short gut syndrome    Valvular heart disease 04/11/2017   a.) TTE 04/11/2017: EF 50%; mild TR, mod MR; b.) TTE 08/13/2017: EF 55-60%, mod TR, sev MR; c.) TEE 08/24/2017: EF 50-55%, triv TR, mod MR; d.) TTE 07/17/2019: EF >55%, triv PR, mod TR, sev MR; e.) TTE 07/19/2021: EF >  55%, mild MR/TR   Vocal cord dysfunction    Past Surgical History:  Procedure Laterality Date   APPENDECTOMY     ATRIAL FIBRILLATION ABLATION N/A 10/25/2017   Procedure: PVI ABLATION   CARDIOVERSION N/A 05/23/2017   Procedure: CARDIOVERSION;  Surgeon: [name], MD;  Location: [facility] ORS;  Service: Cardiovascular;  Laterality: N/A;   CARDIOVERSION N/A 06/22/2017   Procedure: CARDIOVERSION;   Surgeon: [name], MD;  Location: [facility] ORS;  Service: Cardiovascular;  Laterality: N/A;   CATARACT EXTRACTION W/PHACO Right 10/26/2016   Procedure: CATARACT EXTRACTION PHACO AND INTRAOCULAR LENS PLACEMENT ([place]) suture placed in right eye at end of procedure;  Surgeon: [name], MD;  Location: [facility] ORS;  Service: Ophthalmology;  Laterality: Right;  [place]  01:21 AP% 23.7 CDE 36.24 Fluid pack lot # [id number]   COLON SURGERY     RESECTION 2 ft sm/ 2 large intestine   COLONOSCOPY WITH PROPOFOL N/A 03/18/2021   Procedure: COLONOSCOPY WITH PROPOFOL;  Surgeon: Annamaria Helling, DO;  Location: [facility] ENDOSCOPY;  Service: Gastroenterology;  Laterality: N/A;   DILATION AND CURETTAGE OF UTERUS     IR NEPHROSTOMY PLACEMENT RIGHT  01/17/2018   IR NEPHROSTOMY PLACEMENT RIGHT  02/27/2018   NEPHROLITHOTOMY Right 02/28/2018   Procedure: NEPHROLITHOTOMY PERCUTANEOUS;  Surgeon: Hollice Espy, MD;  Location: [facility] ORS;  Service: Urology;  Laterality: Right;   REPLACEMENT TOTAL KNEE BILATERAL     TEE WITHOUT CARDIOVERSION N/A 08/24/2017   Procedure: TRANSESOPHAGEAL ECHOCARDIOGRAM (TEE);  Surgeon: [name], MD;  Location: [facility] ORS;  Service: Cardiovascular;  Laterality: N/A;   TOTAL KNEE ARTHROPLASTY Bilateral    TOTAL VAGINAL HYSTERECTOMY N/A 1997   Family History  Problem Relation Age of Onset   Breast cancer Daughter 94   Social History   Tobacco Use   Smoking status: Never   Smokeless tobacco: Never  Vaping Use   Vaping Use: Never used  Substance Use Topics   Alcohol use: No   Drug use: No    Pertinent Clinical Results:  LABS: Labs reviewed: Acceptable for surgery.  Hospital Outpatient Visit on 12/30/2021  Component Date Value Ref Range Status   WBC 12/30/2021 6.0  4.0 - 10.5 K/uL Final   RBC 12/30/2021 4.58  3.87 - 5.11 MIL/uL Final   Hemoglobin 12/30/2021 13.6  12.0 - 15.0 g/dL Final   HCT 12/30/2021 41.3  36.0 - 46.0 % Final   MCV 12/30/2021 90.2  80.0 - 100.0  fL Final   MCH 12/30/2021 29.7  26.0 - 34.0 pg Final   MCHC 12/30/2021 32.9  30.0 - 36.0 g/dL Final   RDW 12/30/2021 12.9  11.5 - 15.5 % Final   Platelets 12/30/2021 214  150 - 400 K/uL Final   nRBC 12/30/2021 0.0  0.0 - 0.2 % Final   Performed at [facility], [place], [place] [address]   Sodium 12/30/2021 138  135 - 145 mmol/L Final   Potassium 12/30/2021 3.8  3.5 - 5.1 mmol/L Final   Chloride 12/30/2021 107  98 - 111 mmol/L Final   CO2 12/30/2021 25  22 - 32 mmol/L Final   Glucose, Bld 12/30/2021 96  70 - 99 mg/dL Final   Glucose reference range applies only to samples taken after fasting for at least 8 hours.   BUN 12/30/2021 21  8 - 23 mg/dL Final   Creatinine, Ser 12/30/2021 1.03 (H)  0.44 - 1.00 mg/dL Final   Calcium 12/30/2021 8.9  8.9 - 10.3  mg/dL Final   GFR, Estimated 12/30/2021 58 (L)  >60 mL/min Final   Comment: (NOTE) Calculated using the CKD-EPI Creatinine Equation (2021)    Anion gap 12/30/2021 6  5 - 15 Final   Performed at [facility], [address] [place], [place] [address]    ECG: Date: 12/30/2021 Time ECG obtained: 1056 AM Rate: 63 bpm Rhythm: normal sinus Axis (leads I and aVF): Normal Intervals: PR 144 ms. QRS 92 ms. QTc 431 ms. ST segment and T wave changes: No evidence of acute ST segment elevation or depression Comparison: Previous obtained tracing on 01/14/2018 showed sinus tachycardia at a rate of 129 bpm.  There were no ST or T wave changes concerning for ischemia.   IMAGING / PROCEDURES: CT RENAL STONE STUDY performed on 11/11/2021 Nonobstructing right renal stones. Status post partial colectomy with patent anastomosis. No acute abnormality is noted.  TRANSTHORACIC ECHOCARDIOGRAM performed on 07/19/2021 Normal left ventricular systolic function with an EF of >55% No regional wall motion abnormality mild MR and TR No AR or PR Normal gradients; no valvular stenosis No pericardial  effusion  TRANSESOPHAGEAL ECHOCARDIOGRAM performed on 08/24/2017 Left ventricle: The cavity size was normal. Wall thickness was normal. Systolic function was normal. The estimated EF was in the range of 50-55%.  Aortic valve: No evidence of vegetation.  Mitral valve: There was moderate regurgitation.  Left atrium: The atrium was dilated.  Right atrium: No evidence of thrombus in the atrial cavity or appendage.  Atrial septum: No defect or patent foramen ovale was identified.  Tricuspid valve: No evidence of vegetation.  Pulmonic valve: No evidence of vegetation.   Impression and Plan:  Emily Velazquez has been referred for pre-anesthesia review and clearance prior to her undergoing the planned anesthetic and procedural courses. Available labs, pertinent testing, and imaging results were personally reviewed by me. This patient has been appropriately cleared by cardiology with an overall LOW risk of significant perioperative cardiovascular complications.  Based on clinical review performed today (12/30/21), barring any significant acute changes in the patient's overall condition, it is anticipated that she will be able to proceed with the planned surgical intervention. Any acute changes in clinical condition may necessitate her procedure being postponed and/or cancelled. Patient will meet with anesthesia team (MD and/or CRNA) on the day of her procedure for preoperative evaluation/assessment. Questions regarding anesthetic course will be fielded at that time.   Pre-surgical instructions were reviewed with the patient during her PAT appointment and questions were fielded by PAT clinical staff. Patient was advised that if any questions or concerns arise prior to her procedure then she should return a call to PAT and/or her surgeon's office to discuss.  Honor Loh, MSN, APRN, FNP-C, CEN [facility]  Peri-operative Services Nurse Practitioner Phone: [phone] Fax: [phone] 12/30/21 6:00 PM  NOTE: This note has been prepared using Dragon dictation software. Despite my best ability to proofread, there is always the potential that unintentional transcriptional errors may still occur from this process.

## 2022-01-02 MED ORDER — CHLORHEXIDINE GLUCONATE 0.12 % MT SOLN: 15.0000 mL | Freq: Once | OROMUCOSAL | Status: AC

## 2022-01-02 MED ORDER — LACTATED RINGERS IV SOLN: INTRAVENOUS | Status: DC

## 2022-01-02 MED ORDER — CEFAZOLIN SODIUM-DEXTROSE 2-4 GM/100ML-% IV SOLN
2.0000 g | INTRAVENOUS | Status: AC
  Administered 2022-01-03: 2 g via INTRAVENOUS

## 2022-01-02 MED ORDER — ORAL CARE MOUTH RINSE: 15.0000 mL | Freq: Once | OROMUCOSAL | Status: AC

## 2022-01-02 MED ORDER — FAMOTIDINE 20 MG PO TABS: 20.0000 mg | ORAL_TABLET | Freq: Once | ORAL | Status: AC

## 2022-01-03 ENCOUNTER — Encounter: Payer: Self-pay | Admitting: Urology

## 2022-01-03 ENCOUNTER — Ambulatory Visit
Admission: RE | Admit: 2022-01-03 | Discharge: 2022-01-03 | Disposition: A | Discharge status: Home / Self Care | Payer: Medicare Other | Attending: Urology | Admitting: Urology

## 2022-01-03 ENCOUNTER — Other Ambulatory Visit: Payer: Self-pay

## 2022-01-03 ENCOUNTER — Ambulatory Visit: Payer: Medicare Other | Admitting: Urgent Care

## 2022-01-03 ENCOUNTER — Encounter
Admission: RE | Disposition: A | Discharge status: Home / Self Care | Payer: Self-pay | Source: Home / Self Care | Attending: Urology

## 2022-01-03 ENCOUNTER — Ambulatory Visit: Payer: Medicare Other

## 2022-01-03 DIAGNOSIS — Z87440 Personal history of urinary (tract) infections: Secondary | ICD-10-CM | POA: Insufficient documentation

## 2022-01-03 DIAGNOSIS — N200 Calculus of kidney: Secondary | ICD-10-CM | POA: Insufficient documentation

## 2022-01-03 HISTORY — PX: CYSTOSCOPY/URETEROSCOPY/HOLMIUM LASER/STENT PLACEMENT: SHX6546

## 2022-01-03 HISTORY — DX: Bradycardia, unspecified: R00.1

## 2022-01-03 HISTORY — DX: Atherosclerosis of aorta: I70.0

## 2022-01-03 HISTORY — DX: Chronic kidney disease, stage 3 unspecified: N18.30

## 2022-01-03 HISTORY — DX: Fatty (change of) liver, not elsewhere classified: K76.0

## 2022-01-03 HISTORY — DX: Hyperlipidemia, unspecified: E78.5

## 2022-01-03 HISTORY — DX: Deficiency of other specified B group vitamins: E53.8

## 2022-01-03 HISTORY — DX: Unspecified cataract: H26.9

## 2022-01-03 SURGERY — CYSTOSCOPY/URETEROSCOPY/HOLMIUM LASER/STENT PLACEMENT
Anesthesia: General | Laterality: Right

## 2022-01-03 MED ORDER — FENTANYL CITRATE (PF) 100 MCG/2ML IJ SOLN
INTRAMUSCULAR | Status: DC | PRN
Start: 2022-01-03 — End: 2022-01-03
  Administered 2022-01-03: 75 ug via INTRAVENOUS

## 2022-01-03 MED ORDER — CHLORHEXIDINE GLUCONATE 0.12 % MT SOLN
OROMUCOSAL | Status: AC
  Administered 2022-01-03: 15 mL via OROMUCOSAL
  Filled 2022-01-03: qty 15

## 2022-01-03 MED ORDER — OXYCODONE-ACETAMINOPHEN 5-325 MG PO TABS: 1.0000 | ORAL_TABLET | ORAL | 0 refills | Status: DC | PRN

## 2022-01-03 MED ORDER — FENTANYL CITRATE (PF) 100 MCG/2ML IJ SOLN
INTRAMUSCULAR | Status: AC
  Filled 2022-01-03: qty 2

## 2022-01-03 MED ORDER — DEXAMETHASONE SODIUM PHOSPHATE 10 MG/ML IJ SOLN
INTRAMUSCULAR | Status: DC | PRN
  Administered 2022-01-03: 10 mg via INTRAVENOUS

## 2022-01-03 MED ORDER — ROCURONIUM BROMIDE 100 MG/10ML IV SOLN
INTRAVENOUS | Status: DC | PRN
  Administered 2022-01-03: 50 mg via INTRAVENOUS

## 2022-01-03 MED ORDER — CEFAZOLIN SODIUM-DEXTROSE 2-4 GM/100ML-% IV SOLN
INTRAVENOUS | Status: AC
  Filled 2022-01-03: qty 100

## 2022-01-03 MED ORDER — EPHEDRINE SULFATE (PRESSORS) 50 MG/ML IJ SOLN
INTRAMUSCULAR | Status: DC | PRN
  Administered 2022-01-03 (×2): 5 mg via INTRAVENOUS

## 2022-01-03 MED ORDER — GLYCOPYRROLATE 0.2 MG/ML IJ SOLN
INTRAMUSCULAR | Status: DC | PRN
  Administered 2022-01-03: .2 mg via INTRAVENOUS

## 2022-01-03 MED ORDER — PHENYLEPHRINE 80 MCG/ML (10ML) SYRINGE FOR IV PUSH (FOR BLOOD PRESSURE SUPPORT)
PREFILLED_SYRINGE | INTRAVENOUS | Status: DC | PRN
  Administered 2022-01-03: 160 ug via INTRAVENOUS
  Administered 2022-01-03 (×2): 80 ug via INTRAVENOUS

## 2022-01-03 MED ORDER — TAMSULOSIN HCL 0.4 MG PO CAPS: 0.4000 mg | ORAL_CAPSULE | Freq: Every day | ORAL | 0 refills | Status: DC

## 2022-01-03 MED ORDER — ONDANSETRON HCL 4 MG/2ML IJ SOLN
INTRAMUSCULAR | Status: DC | PRN
  Administered 2022-01-03 (×2): 4 mg via INTRAVENOUS

## 2022-01-03 MED ORDER — ACETAMINOPHEN 10 MG/ML IV SOLN
INTRAVENOUS | Status: DC | PRN
  Administered 2022-01-03: 1000 mg via INTRAVENOUS

## 2022-01-03 MED ORDER — IOHEXOL 180 MG/ML  SOLN
INTRAMUSCULAR | Status: DC | PRN
  Administered 2022-01-03: 10 mL

## 2022-01-03 MED ORDER — LIDOCAINE HCL (CARDIAC) PF 100 MG/5ML IV SOSY
PREFILLED_SYRINGE | INTRAVENOUS | Status: DC | PRN
  Administered 2022-01-03: 100 mg via INTRAVENOUS

## 2022-01-03 MED ORDER — SODIUM CHLORIDE 0.9 % IR SOLN
Status: DC | PRN
  Administered 2022-01-03: 800 mL

## 2022-01-03 MED ORDER — PROPOFOL 10 MG/ML IV BOLUS
INTRAVENOUS | Status: DC | PRN
  Administered 2022-01-03: 150 mg via INTRAVENOUS

## 2022-01-03 MED ORDER — FAMOTIDINE 20 MG PO TABS
ORAL_TABLET | ORAL | Status: AC
  Administered 2022-01-03: 20 mg via ORAL
  Filled 2022-01-03: qty 1

## 2022-01-03 MED ORDER — PROPOFOL 1000 MG/100ML IV EMUL
INTRAVENOUS | Status: AC
  Filled 2022-01-03: qty 200

## 2022-01-03 MED ORDER — FENTANYL CITRATE (PF) 100 MCG/2ML IJ SOLN: 25.0000 ug | INTRAMUSCULAR | Status: DC | PRN

## 2022-01-03 MED ORDER — SUGAMMADEX SODIUM 200 MG/2ML IV SOLN
INTRAVENOUS | Status: DC | PRN
  Administered 2022-01-03: 400 mg via INTRAVENOUS

## 2022-01-03 MED ORDER — SEVOFLURANE IN SOLN
RESPIRATORY_TRACT | Status: AC
  Filled 2022-01-03: qty 250

## 2022-01-03 MED ORDER — ACETAMINOPHEN 10 MG/ML IV SOLN
INTRAVENOUS | Status: AC
Start: 2022-01-03 — End: ?
  Filled 2022-01-03: qty 100

## 2022-01-03 MED ORDER — OXYCODONE HCL 5 MG PO TABS: 5.0000 mg | ORAL_TABLET | Freq: Once | ORAL | Status: DC | PRN

## 2022-01-03 MED ORDER — OXYCODONE HCL 5 MG/5ML PO SOLN: 5.0000 mg | Freq: Once | ORAL | Status: DC | PRN

## 2022-01-03 MED ORDER — OXYBUTYNIN CHLORIDE 5 MG PO TABS
5.0000 mg | ORAL_TABLET | Freq: Three times a day (TID) | ORAL | 0 refills | Status: DC | PRN
Start: 2022-01-03 — End: 2022-02-08

## 2022-01-03 SURGICAL SUPPLY — 30 items
ADH LQ OCL WTPRF AMP STRL LF (MISCELLANEOUS)
ADHESIVE MASTISOL STRL (MISCELLANEOUS) IMPLANT
BAG DRAIN CYSTO-URO LG1000N (MISCELLANEOUS) ×2 IMPLANT
BASKET ZERO TIP 1.9FR (BASKET) IMPLANT
BRUSH SCRUB EZ 1% IODOPHOR (MISCELLANEOUS) ×1 IMPLANT
BSKT STON RTRVL ZERO TP 1.9FR (BASKET)
CATH URET FLEX-TIP 2 LUMEN 10F (CATHETERS) ×1 IMPLANT
CATH URETL OPEN 5X70 (CATHETERS) ×2 IMPLANT
CNTNR SPEC 2.5X3XGRAD LEK (MISCELLANEOUS)
CONT SPEC 4OZ STER OR WHT (MISCELLANEOUS)
CONT SPEC 4OZ STRL OR WHT (MISCELLANEOUS)
CONTAINER SPEC 2.5X3XGRAD LEK (MISCELLANEOUS) IMPLANT
DRAPE UTILITY 15X26 TOWEL STRL (DRAPES) ×2 IMPLANT
DRSG TEGADERM 2-3/8X2-3/4 SM (GAUZE/BANDAGES/DRESSINGS) IMPLANT
GLOVE BIO SURGEON STRL SZ 6.5 (GLOVE) ×3 IMPLANT
GOWN STRL REUS W/ TWL LRG LVL3 (GOWN DISPOSABLE) ×2 IMPLANT
GOWN STRL REUS W/TWL LRG LVL3 (GOWN DISPOSABLE) ×4
GUIDEWIRE GREEN .038 145CM (MISCELLANEOUS) IMPLANT
GUIDEWIRE STR DUAL SENSOR (WIRE) ×2 IMPLANT
IV NS IRRIG 3000ML ARTHROMATIC (IV SOLUTION) ×2 IMPLANT
KIT TURNOVER CYSTO (KITS) ×2 IMPLANT
PACK CYSTO AR (MISCELLANEOUS) ×2 IMPLANT
SET CYSTO W/LG BORE CLAMP LF (SET/KITS/TRAYS/PACK) ×2 IMPLANT
SHEATH NAVIGATOR HD 12/14X36 (SHEATH) IMPLANT
STENT URET 6FRX24 CONTOUR (STENTS) ×1 IMPLANT
STENT URET 6FRX26 CONTOUR (STENTS) IMPLANT
SURGILUBE 2OZ TUBE FLIPTOP (MISCELLANEOUS) ×2 IMPLANT
TRACTIP FLEXIVA PULSE ID 200 (Laser) ×2 IMPLANT
WATER STERILE IRR 1000ML POUR (IV SOLUTION) ×2 IMPLANT
WATER STERILE IRR 500ML POUR (IV SOLUTION) ×2 IMPLANT

## 2022-01-03 NOTE — Anesthesia Procedure Notes (Signed)
Procedure Name: Intubation Date/Time: 01/03/2022 7:55 AM  Performed by: Kelton Pillar, CRNAPre-anesthesia Checklist: Patient identified, Emergency Drugs available, Suction available and Patient being monitored Patient Re-evaluated:Patient Re-evaluated prior to induction Oxygen Delivery Method: Circle system utilized Preoxygenation: Pre-oxygenation with 100% oxygen Induction Type: IV induction Ventilation: Mask ventilation without difficulty Laryngoscope Size: McGraph and 3 Grade View: Grade I Tube type: Oral Tube size: 7.0 mm Number of attempts: 1 Airway Equipment and Method: Stylet and Oral airway Placement Confirmation: ETT inserted through vocal cords under direct vision, positive ETCO2, breath sounds checked- equal and bilateral and CO2 detector Secured at: 21 cm Tube secured with: Tape Dental Injury: Teeth and Oropharynx as per pre-operative assessment

## 2022-01-03 NOTE — Anesthesia Postprocedure Evaluation (Signed)
Anesthesia Post Note  Patient: Emily Velazquez  Procedure(s) Performed: CYSTOSCOPY/URETEROSCOPY/HOLMIUM LASER/STENT PLACEMENT (Right)  Patient location during evaluation: PACU Anesthesia Type: General Level of consciousness: awake and alert Pain management: pain level controlled Vital Signs Assessment: post-procedure vital signs reviewed and stable Respiratory status: spontaneous breathing, nonlabored ventilation, respiratory function stable and patient connected to nasal cannula oxygen Cardiovascular status: blood pressure returned to baseline and stable Postop Assessment: no apparent nausea or vomiting Anesthetic complications: no   No notable events documented.   Last Vitals:  Vitals:   01/03/22 0641  BP: (!) 137/49  Pulse: 68  Resp: 16  Temp: 36.8 C  SpO2: 96%    Last Pain:  Vitals:   01/03/22 0641  TempSrc: Temporal  PainSc: 0-No pain                 Dimas Millin

## 2022-01-03 NOTE — Anesthesia Preprocedure Evaluation (Signed)
Anesthesia Evaluation  Patient identified by MRN, date of birth, ID band Patient awake    Reviewed: Allergy & Precautions, NPO status , Patient's Chart, lab work & pertinent test results  Airway Mallampati: III  TM Distance: >3 FB     Dental  (+) Teeth Intact   Pulmonary neg pulmonary ROS,    breath sounds clear to auscultation       Cardiovascular hypertension, negative cardio ROS   Rhythm:Regular Rate:Normal     Neuro/Psych negative neurological ROS  negative psych ROS   GI/Hepatic negative GI ROS, Neg liver ROS,   Endo/Other  negative endocrine ROS  Renal/GU negative Renal ROS  negative genitourinary   Musculoskeletal negative musculoskeletal ROS (+)   Abdominal   Peds negative pediatric ROS (+)  Hematology negative hematology ROS (+)   Anesthesia Other Findings Past Medical History: No date: Anginal pain (HCC) No date: Aortic atherosclerosis (HCC) No date: Arthritis No date: B12 deficiency No date: Basal cell carcinoma No date: Bradycardia No date: Cataract     Comment:  a.) s/p extraction No date: CKD (chronic kidney disease), stage III (HCC) No date: Edema No date: Gout No date: Graves disease     Comment:  a.) s/p remote Tx with PTU No date: Heart murmur No date: Hepatic steatosis No date: History of kidney stones No date: HLD (hyperlipidemia) No date: Hypertension No date: IBS (irritable bowel syndrome) No date: Kidney stones No date: Low vitamin B12 level No date: OAB (overactive bladder) No date: PAF (paroxysmal atrial fibrillation) (HCC)     Comment:  a.) CHA2DS2-VASc = 4 (age, sex, HTN, aortic plaque);                b.) s/p DCCV 05/23/2017 (150 J x 2); DCCV 06/22/2017               aborted d/t spontaneous conversion; PVI ablation               10/25/2017; c.) rate/rhythm maintained on oral amiodarone              + metoprolol tartrate; no chronic anticoagulation No date:  Pneumonia No date: Pre-diabetes No date: Rosacea No date: Short gut syndrome 04/11/2017: Valvular heart disease     Comment:  a.) TTE 04/11/2017: EF 50%; mild TR, mod MR; b.) TTE               08/13/2017: EF 55-60%, mod TR, sev MR; c.) TEE               08/24/2017: EF 50-55%, triv TR, mod MR; d.) TTE               07/17/2019: EF >55%, triv PR, mod TR, sev MR; e.) TTE               07/19/2021: EF >55%, mild MR/TR No date: Vocal cord dysfunction   Reproductive/Obstetrics negative OB ROS                             Anesthesia Physical Anesthesia Plan  ASA: 3  Anesthesia Plan: General   Post-op Pain Management:    Induction: Intravenous  PONV Risk Score and Plan: 3  Airway Management Planned: Oral ETT  Additional Equipment:   Intra-op Plan:   Post-operative Plan: Extubation in OR  Informed Consent: I have reviewed the patients History and Physical, chart, labs and discussed the procedure including the risks, benefits and alternatives for  the proposed anesthesia with the patient or authorized representative who has indicated his/her understanding and acceptance.     Dental Advisory Given  Plan Discussed with: Anesthesiologist, CRNA and Surgeon  Anesthesia Plan Comments: (Patient consented for risks of anesthesia including but not limited to:  - adverse reactions to medications - damage to eyes, teeth, lips or other oral mucosa - nerve damage due to positioning  - sore throat or hoarseness - Damage to heart, brain, nerves, lungs, other parts of body or loss of life  Patient voiced understanding.)        Anesthesia Quick Evaluation

## 2022-01-03 NOTE — Op Note (Signed)
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  Extreme 14 mm left lower pole stone not accessible likely within stenotic infundibulum/obliterated infundibulum.  2 additional stones treated up to 6 mm.  Mild trigonitis and ureteritis cystica.  EBL: Minimal  Specimens: None  Drains: 6 x 24 French double-J ureteral stent on left with tether  Indication: Emily Velazquez is a 71 y.o. patient with personal history of recurrent nephrolithiasis found to have enlarging right-sided stone burden along with urinary tract infections.  After reviewing the management options for treatment, he elected to proceed with the above surgical procedure(s). We have discussed the potential benefits and risks of the procedure, side effects of the proposed treatment, the likelihood of the patient achieving the goals of the procedure, and any potential problems that might occur during the procedure or recuperation. Informed consent has been obtained.  Description of procedure:  The patient was taken to the operating room and general anesthesia was induced.  The patient was placed in the dorsal lithotomy position, prepped and draped in the usual sterile fashion, and preoperative antibiotics were administered. A preoperative time-out was performed.   A 21 French scope was advanced per urethra into the bladder.  Attention was turned to the right ureteral orifice.  On scout imaging, stones in question could be seen.  Gentle retrograde pyelogram on this side revealed a  somewhat unusual appearing collecting system with a few what appear to be either blunted or stenotic infundibula and malrotation appreciated.  A wire was then placed up to the level of the kidney.  A dual-lumen access sheath was used to introduce a second Super Stiff wire up to the kidney.  Next, a 12/14 French ureteral access sheath was advanced to the proximal ureter which went easily.  The safety wire was excluded and snapped in place.  A 8 French dual lumen digital flexible ureteroscope was advanced into the renal pelvis.  An upper pole calyx, a larger 6 mm stone was identified and using a 242 m laser fiber with 0.3 J and 60 Hz, the stone was dusted.  A second smaller stone was also identified and dusted.  There is no obvious lower pole moiety.  There was one infundibulum which was extremely stenotic and scarred and unfortunately, despite manipulation and attempt at lasering this infundibulum, it was not able to navigate the scope into this area.  Is possible that this may be previously been the access pathway to the extreme lower pole stone although not with complete certainty.  I directed some contrast here in the contrast also did not go down to this area consistent with obliteration of this moiety.  At this point in time, was deemed at the extreme lower pole stone was not in continuity with the collecting system or at least accessible ureteroscopically.  As such, a final retrograde pyelogram showed no contrast extravasation.  Each every calyx which was successful Strukel visualized and no significant residual stone burden remained other than some small stone dust.  The scope was then backed down the length of the ureter inspecting along the way.  There was some very subtle ureteritis cystica appreciated but otherwise no tumors,  masses, stone burden or injuries appreciated.  Finally, a 6 x 24 French double-J ureteral stent was advanced over the wire up to the level of the kidney.  Upon wire withdrawal, a  full coil was noted both within the renal pelvis as well as within the bladder.  The bladder was drained.  The stent string was left attached to the distal coil of the stent which was affixed to the patient's left inner thigh using Mastisol and Tegaderm.  She was then cleaned and dried, repositioned in supine position, reversed from anesthesia, taken to the PACU in stable condition.  Plan: We will have her remove her own stent on Thursday.  We will follow-up with renal ultrasound in about a month.  Hollice Espy, M.D.

## 2022-01-03 NOTE — Anesthesia Postprocedure Evaluation (Signed)
Anesthesia Post Note  Patient: Emily Velazquez  Procedure(s) Performed: CYSTOSCOPY/URETEROSCOPY/HOLMIUM LASER/STENT PLACEMENT (Right)  Patient location during evaluation: PACU Anesthesia Type: General Level of consciousness: awake and alert Pain management: pain level controlled Vital Signs Assessment: post-procedure vital signs reviewed and stable Respiratory status: spontaneous breathing, nonlabored ventilation, respiratory function stable and patient connected to nasal cannula oxygen Cardiovascular status: blood pressure returned to baseline and stable Postop Assessment: no apparent nausea or vomiting Anesthetic complications: no   No notable events documented.   Last Vitals:  Vitals:   01/03/22 0641 01/03/22 0835  BP: (!) 137/49 137/71  Pulse: 68 85  Resp: 16 (!) 21  Temp: 36.8 C (!) 36.3 C  SpO2: 96% 97%    Last Pain:  Vitals:   01/03/22 0835  TempSrc:   PainSc: 0-No pain                 Dimas Millin

## 2022-01-03 NOTE — Transfer of Care (Signed)
Immediate Anesthesia Transfer of Care Note  Patient: [name]  Procedure(s) Performed: CYSTOSCOPY/URETEROSCOPY/HOLMIUM LASER/STENT PLACEMENT (Right)  Patient Location: PACU  Anesthesia Type:General  Level of Consciousness: awake, drowsy and patient cooperative  Airway & Oxygen Therapy: Patient Spontanous Breathing and Patient connected to face mask oxygen  Post-op Assessment: Report given to RN and Post -op Vital signs reviewed and stable  Post vital signs: Reviewed and stable  Last Vitals:  Vitals Value Taken Time  BP 137/71 01/03/22 0835  Temp 36.3 C 01/03/22 0835  Pulse 84 01/03/22 0845  Resp 20 01/03/22 0845  SpO2 93 % 01/03/22 0845  Vitals shown include unvalidated device data.  Last Pain:  Vitals:   01/03/22 0835  TempSrc:   PainSc: 0-No pain         Complications: No notable events documented.

## 2022-01-03 NOTE — H&P (Signed)
01/03/22 RRR CTAB  Agree with plan below  .Emily MIDDLESWORTH June 12, 1951 [id number]   CC:    Chief Complaint  Patient presents with   Results    HPI: Emily Velazquez is a 71 y.o. female with PMH nephrolithiasis and multiple recent positive urine cultures with multiple species who presents today to discuss definitive stone management.    She underwent right PCNL with Dr. Erlene Quan in 2019.  As part of her work-up for her recent recurrent versus persistent polymicrobial UTIs, I obtain a CT stone study, which revealed multiple nonobstructing right renal stones measuring up to 12 mm.   Today she reports no dysuria or lower abdominal pain consistent with her recent infections.  She is interested in pursuing proactive stone management for her right renal stones.   In-office UA today positive for trace lysed blood and 1+ leukocyte esterase; urine microscopy with 11-30 WBCs/HPF, 3-10 RBCs/HPF, and moderate bacteria.    PMH:     Past Medical History:  Diagnosis Date   Anginal pain ([place])     Arthritis     Atrial fibrillation ([place])     Basal cell carcinoma     Edema     Fatty liver      history   Gout     Graves disease     Heart murmur     History of atrioventricular nodal ablation     History of kidney stones     Hypertension     IBS (irritable bowel syndrome)     Kidney stones     Low vitamin B12 level     Mitral regurgitation     OAB (overactive bladder)     PAF (paroxysmal atrial fibrillation) ([facility])     Rosacea     Short gut syndrome     Vocal cord dysfunction        Surgical History:      Past Surgical History:  Procedure Laterality Date   APPENDECTOMY       CARDIOVERSION N/A 05/23/2017    Procedure: CARDIOVERSION;  Surgeon: [name], MD;  Location: [facility];  Service: Cardiovascular;  Laterality: N/A;   CARDIOVERSION N/A 06/22/2017    Procedure: CARDIOVERSION;  Surgeon: [name], MD;  Location: [facility];  Service: Cardiovascular;  Laterality: N/A;    CATARACT EXTRACTION W/PHACO Right 10/26/2016    Procedure: CATARACT EXTRACTION PHACO AND INTRAOCULAR LENS PLACEMENT ([place]) suture placed in right eye at end of procedure;  Surgeon: [name], MD;  Location: [facility];  Service: Ophthalmology;  Laterality: Right;  [place]  01:21 AP% 23.7 CDE 36.24 Fluid pack lot # [id number]   COLON SURGERY        RESECTION 2 ft sm/ 2 large intestine   COLONOSCOPY WITH PROPOFOL N/A 03/18/2021    Procedure: COLONOSCOPY WITH PROPOFOL;  Surgeon: Annamaria Helling, DO;  Location: [facility];  Service: Gastroenterology;  Laterality: N/A;   DILATION AND CURETTAGE OF UTERUS       IR NEPHROSTOMY PLACEMENT RIGHT   01/17/2018   IR NEPHROSTOMY PLACEMENT RIGHT   02/27/2018   JOINT REPLACEMENT        BIL TKR   NEPHROLITHOTOMY Right 02/28/2018    Procedure: NEPHROLITHOTOMY PERCUTANEOUS;  Surgeon: Hollice Espy, MD;  Location: [facility];  Service: Urology;  Laterality: Right;   REPLACEMENT TOTAL KNEE BILATERAL       TEE WITHOUT CARDIOVERSION N/A 08/24/2017    Procedure: TRANSESOPHAGEAL ECHOCARDIOGRAM (TEE);  Surgeon: [name], MD;  Location: [facility]  ORS;  Service: Cardiovascular;  Laterality: N/A;   VAGINAL HYSTERECTOMY   1997    complete      Home Medications:  Allergies as of 11/29/2021         Reactions    Cefuroxime Other (See Comments)    Patient states she has extreme abdominal pain and its not really effective for her.    Ace Inhibitors Itching    Codeine Sulfate Itching         Glucosamine      Latex Itching, Other (See Comments)    Gloves     Levofloxacin Other (See Comments)    Gastritis.  Onset 08/03/1999.    Shellfish-derived Products Other (See Comments)    Unknown, showed up on allergy test    Sulfa Antibiotics Itching            Medication List           Accurate as of November 29, 2021 11:59 PM. If you have any questions, ask your nurse or doctor.              acetaminophen 500 MG tablet Commonly known as: TYLENOL Take 500  mg by mouth every 8 (eight) hours as needed for mild pain.    amiodarone 200 MG tablet Commonly known as: PACERONE Take 200 mg by mouth daily.    amLODipine 10 MG tablet Commonly known as: NORVASC Take 10 mg by mouth daily.    cetirizine 10 MG tablet Commonly known as: ZYRTEC Take 10 mg by mouth daily.    estradiol 0.5 MG tablet Commonly known as: ESTRACE Take 0.5 mg by mouth daily.    metoprolol tartrate 50 MG tablet Commonly known as: LOPRESSOR Take 50 mg by mouth 2 (two) times daily.    valsartan 320 MG tablet Commonly known as: DIOVAN Take 320 mg by mouth daily.             Allergies:       Allergies  Allergen Reactions   Cefuroxime Other (See Comments)      Patient states she has extreme abdominal pain and its not really effective for her.   Ace Inhibitors Itching   Codeine Sulfate Itching            Glucosamine     Latex Itching and Other (See Comments)      Gloves    Levofloxacin Other (See Comments)      Gastritis.  Onset 08/03/1999.   Shellfish-Derived Products Other (See Comments)      Unknown, showed up on allergy test   Sulfa Antibiotics Itching      Family History:      Family History  Problem Relation Age of Onset   Breast cancer Daughter 32      Social History:   reports that she has never smoked. She has never used smokeless tobacco. She reports that she does not drink alcohol and does not use drugs.   Physical Exam: BP (!) 160/77   Pulse 72   Ht '5\' 3"'$  (1.6 m)   Wt 220 lb (99.8 kg)   BMI 38.97 kg/m   Constitutional:  Alert and oriented, no acute distress, nontoxic appearing HEENT: [place], AT Cardiovascular: No clubbing, cyanosis, or edema Respiratory: Normal respiratory effort, no increased work of breathing Skin: No rashes, bruises or suspicious lesions Neurologic: Grossly intact, no focal deficits, moving all 4 extremities Psychiatric: Normal mood and affect   Laboratory Data:      Results for orders placed or  performed in  visit on 11/29/21  Microscopic Examination    Urine  Result Value Ref Range    WBC, UA 11-30 (A) 0 - 5 /hpf    RBC 3-10 (A) 0 - 2 /hpf    Epithelial Cells (non renal) 0-10 0 - 10 /hpf    Bacteria, UA Moderate (A) None seen/Few  Urinalysis, Complete  Result Value Ref Range    Specific Gravity, UA 1.025 1.005 - 1.030    pH, UA 5.5 5.0 - 7.5    Color, UA Yellow Yellow    Appearance Ur Hazy (A) Clear    Leukocytes,UA 1+ (A) Negative    Protein,UA Negative Negative/Trace    Glucose, UA Negative Negative    Ketones, UA Negative Negative    RBC, UA Trace (A) Negative    Bilirubin, UA Negative Negative    Urobilinogen, Ur 0.2 0.2 - 1.0 mg/dL    Nitrite, UA Negative Negative    Microscopic Examination See below:      Pertinent Imaging: Results for orders placed during the hospital encounter of 11/11/21   CT RENAL STONE STUDY   Narrative CLINICAL DATA:  History of nephrolithiasis and possible UTI   EXAM: CT ABDOMEN AND PELVIS WITHOUT CONTRAST   TECHNIQUE: Multidetector CT imaging of the abdomen and pelvis was performed following the standard protocol without IV contrast.   RADIATION DOSE REDUCTION: This exam was performed according to the departmental dose-optimization program which includes automated exposure control, adjustment of the mA and/or kV according to patient size and/or use of iterative reconstruction technique.   COMPARISON:  01/17/2018   FINDINGS: Lower chest: No acute abnormality.   Hepatobiliary: Gallbladder is within normal limits. Liver demonstrates a few capsular calcifications stable from the prior exam.   Pancreas: Unremarkable. No pancreatic ductal dilatation or surrounding inflammatory changes.   Spleen: Normal in size without focal abnormality.   Adrenals/Urinary Tract: Adrenal glands are within normal limits. Left kidney demonstrates no renal calculi or obstructive changes. Right kidney demonstrates multiple renal stones the largest of  which measures 12 mm in dimension. The collecting system and ureter are within normal limits bilaterally. The bladder is decompressed.   Stomach/Bowel: No obstructive or inflammatory changes of the colon are seen. Partial colectomy on the right has been performed. The anastomosis is widely patent. The appendix has been surgically removed. Small bowel shows no obstructive changes. The stomach is within normal limits.   Vascular/Lymphatic: Aortic atherosclerosis. No enlarged abdominal or pelvic lymph nodes.   Reproductive: Status post hysterectomy. No adnexal masses.   Other: No abdominal wall hernia or abnormality. No abdominopelvic ascites.   Musculoskeletal: Mild degenerative changes of lumbar spine are noted.   IMPRESSION: Nonobstructing right renal stones.   Status post partial colectomy with patent anastomosis.   No acute abnormality is noted.     Electronically Signed By: Inez Catalina M.D. On: 11/12/2021 02:57   I personally reviewed the images referenced above and note multiple nonobstructing right renal stones.   Assessment & Plan:   1. Right kidney stone Progressive increase in her right renal stone burden since undergoing right PCNL in 2019.   We discussed various treatment options for her stones including surveillance e vs. ESWL vs. ureteroscopy with laser lithotripsy and stent. We discussed the risks and benefits of all procedures including bleeding, infection, and damage to surrounding structures.   We specifically discussed that ESWL is a less invasive procedure that requires less anesthesia, however patients have to pass their residual stone fragments,  which may take several weeks and be associated with continued renal colic. Additionally, we discussed the limitations of ESWL including the ability to only treat one stone in a single treatment and the low, 10-20% chance of treatment failure requiring repeat ESWL versus ureteroscopy in the future. By comparison,  ureteroscopy is a more invasive surgical procedure that requires more anesthesia, but we can potentially treat multiple stones in a single procedure. However, URS does require placement of a ureteral stent, which will remain in place for approximately 3-10 days and can be associated with flank pain, bladder pain, dysuria, urgency, frequency, urinary leakage, and gross hematuria.   Based on this conversation, she would like to proceed with elective right ureteroscopy with laser lithotripsy and stent placement.    2. Recurrent UTI Not clinically infected today.  We will send urine for culture, but defer treatment unless she develops symptoms.  We also discussed that she will likely need antibiotics in the preoperative period to sterilize the urine. - Urinalysis, Complete - CULTURE, URINE COMPREHENSIVE    Return for Will call to schedule surgery.   Debroah Loop, PA-C   [facility] [address] [place], [place] [address] [phone]

## 2022-01-03 NOTE — Discharge Instructions (Addendum)
You have a ureteral stent in place.  This is a tube that extends from your kidney to your bladder.  This may cause urinary bleeding, burning with urination, and urinary frequency.  Please call our office or present to the ED if you develop fevers >101 or pain which is not able to be controlled with oral pain medications.  You may be given either Flomax and/ or ditropan to help with bladder spasms and stent pain in addition to pain medications.    Your stent is on a string taped to your left inner thigh.  On Thursday morning, you may untaped and pull the string gently until the entire stent is removed.  If you have any issues or difficulty, please call our office.  If the stent is prematurely removed, please let us know nonurgently.  [facility] [address], [place], [place] [address] [phone]   AMBULATORY SURGERY  DISCHARGE INSTRUCTIONS   The drugs that you were given will stay in your system until tomorrow so for the next 24 hours you should not:  Drive an automobile Make any legal decisions Drink any alcoholic beverage   You may resume regular meals tomorrow.  Today it is better to start with liquids and gradually work up to solid foods.  You may eat anything you prefer, but it is better to start with liquids, then soup and crackers, and gradually work up to solid foods.   Please notify your doctor immediately if you have any unusual bleeding, trouble breathing, redness and pain at the surgery site, drainage, fever, or pain not relieved by medication.    Additional Instructions:     Please contact your physician with any problems or Same Day Surgery at [phone], Monday through Friday 6 am to 4 pm, or No Name at [facility] number at [phone].

## 2022-01-05 ENCOUNTER — Encounter: Payer: Self-pay | Admitting: Urgent Care

## 2022-01-05 ENCOUNTER — Other Ambulatory Visit: Payer: Self-pay

## 2022-01-05 DIAGNOSIS — N200 Calculus of kidney: Secondary | ICD-10-CM

## 2022-01-07 ENCOUNTER — Telehealth: Payer: Self-pay | Admitting: Urology

## 2022-01-31 ENCOUNTER — Ambulatory Visit
Admission: RE | Admit: 2022-01-31 | Discharge: 2022-01-31 | Disposition: A | Discharge status: Home / Self Care | Payer: Medicare Other | Source: Ambulatory Visit | Attending: Urology | Admitting: Urology

## 2022-01-31 DIAGNOSIS — N200 Calculus of kidney: Secondary | ICD-10-CM | POA: Insufficient documentation

## 2022-02-08 ENCOUNTER — Encounter: Payer: Self-pay | Admitting: Urology

## 2022-02-08 ENCOUNTER — Ambulatory Visit: Payer: Medicare Other | Admitting: Urology

## 2022-02-08 VITALS — BP 152/81 | HR 93 | Ht 63.0 in | Wt 218.0 lb

## 2022-02-08 DIAGNOSIS — N200 Calculus of kidney: Secondary | ICD-10-CM

## 2022-02-08 DIAGNOSIS — Z87440 Personal history of urinary (tract) infections: Secondary | ICD-10-CM

## 2022-02-08 DIAGNOSIS — N390 Urinary tract infection, site not specified: Secondary | ICD-10-CM

## 2022-02-08 DIAGNOSIS — N3281 Overactive bladder: Secondary | ICD-10-CM | POA: Diagnosis not present

## 2022-02-08 LAB — URINALYSIS, COMPLETE
Bilirubin, UA: NEGATIVE
Glucose, UA: NEGATIVE
Ketones, UA: NEGATIVE
Nitrite, UA: NEGATIVE
Protein,UA: NEGATIVE
RBC, UA: NEGATIVE
Specific Gravity, UA: <1.005 — ABNORMAL LOW (ref 1.005–1.030)
Urobilinogen, Ur: 0.2 mg/dL (ref 0.2–1.0)
pH, UA: 5 (ref 5.0–7.5)

## 2022-02-08 LAB — MICROSCOPIC EXAMINATION

## 2022-02-08 MED ORDER — GEMTESA 75 MG PO TABS: 75.0000 mg | ORAL_TABLET | Freq: Every day | ORAL | 11 refills | Status: DC

## 2022-02-08 NOTE — Progress Notes (Signed)
02/08/22 3:55 PM   Emily Velazquez [birth year] [id number]  Referring provider:  Tracie Harrier, MD [address] [facility] [place],  [place] [address] Chief Complaint  Patient presents with   Follow-up    1 mo w/RUS results     HPI: Emily Velazquez is a 71 y.o.female with a personal history of  nephrolithiasis and multiple recent positive urine cultures with multiple species who presents today for a 1 month post-op follow-up with RUS.   She underwent PCNL in 2019. CT stone study for workup on 11/11/2021 visualized multiple nonobstructing right renal stones measuring up to 12 mm.  She is s/p roght ureteroscopy, retrograde pyelogram, and ureteral sten placement on 01/03/2022. 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  Extreme 14 mm left lower pole stone not accessible likely within stenotic infundibulum/obliterated infundibulum.  2 additional stones treated up to 6 mm.  Mild trigonitis and ureteritis cystica.  RUS on 01/31/2022 visualized a 7.2 mm stone in the lower pole of the right kidney without hydronephrosis. The left kidney and bladder are unremarkable.  She reports today that she does have some lower abdominal pressure urgency and frequency.  This is similar to her baseline.  Has been on Myrbetriq in the past however did not feel like it was not effective and was relatively expensive.  She denies any overt dysuria.  She did asked to have her urine checked today as a precaution.  Urinalysis did have greater than 30 white blood cells with, many bacteria but otherwise was negative.    PMH: Past Medical History:  Diagnosis Date   Anginal pain ([place])    Aortic atherosclerosis ([place])    Arthritis    B12 deficiency    Basal cell carcinoma    Bradycardia    Cataract    a.) s/p extraction   CKD (chronic kidney disease), stage III (HCC)    Edema    Gout    Graves disease    a.) s/p  remote Tx with PTU   Heart murmur    Hepatic steatosis    History of kidney stones    HLD (hyperlipidemia)    Hypertension    IBS (irritable bowel syndrome)    Kidney stones    Low vitamin B12 level    OAB (overactive bladder)    PAF (paroxysmal atrial fibrillation) ([place])    a.) CHA2DS2-VASc = 4 (age, sex, HTN, aortic plaque);  b.) s/p DCCV 05/23/2017 (150 J x 2); DCCV 06/22/2017 aborted d/t spontaneous conversion; PVI ablation 10/25/2017; c.) rate/rhythm maintained on oral amiodarone + metoprolol tartrate; no chronic anticoagulation   Pneumonia    Pre-diabetes    Rosacea    Short gut syndrome    Valvular heart disease 04/11/2017   a.) TTE 04/11/2017: EF 50%; mild TR, mod MR; b.) TTE 08/13/2017: EF 55-60%, mod TR, sev MR; c.) TEE 08/24/2017: EF 50-55%, triv TR, mod MR; d.) TTE 07/17/2019: EF >55%, triv PR, mod TR, sev MR; e.) TTE 07/19/2021: EF >55%, mild MR/TR   Vocal cord dysfunction     Surgical History: Past Surgical History:  Procedure Laterality Date   APPENDECTOMY     ATRIAL FIBRILLATION ABLATION N/A 10/25/2017   Procedure: PVI ABLATION   CARDIOVERSION N/A 05/23/2017   Procedure: CARDIOVERSION;  Surgeon: [name], MD;  Location: [facility] ORS;  Service: Cardiovascular;  Laterality: N/A;   CARDIOVERSION N/A 06/22/2017   Procedure: CARDIOVERSION;  Surgeon: [name],  MD;  Location: [facility];  Service: Cardiovascular;  Laterality: N/A;   CATARACT EXTRACTION W/PHACO Right 10/26/2016   Procedure: CATARACT EXTRACTION PHACO AND INTRAOCULAR LENS PLACEMENT ([place]) suture placed in right eye at end of procedure;  Surgeon: [name], MD;  Location: [facility];  Service: Ophthalmology;  Laterality: Right;  [place]  01:21 AP% 23.7 CDE 36.24 Fluid pack lot # [id number]   COLON SURGERY     RESECTION 2 ft sm/ 2 large intestine   COLONOSCOPY WITH PROPOFOL N/A 03/18/2021   Procedure: COLONOSCOPY WITH PROPOFOL;  Surgeon: Annamaria Helling, DO;  [facility] ENDOSCOPY;   Service: Gastroenterology;  Laterality: N/A;   CYSTOSCOPY/URETEROSCOPY/HOLMIUM LASER/STENT PLACEMENT Right 01/03/2022   Procedure: CYSTOSCOPY/URETEROSCOPY/HOLMIUM LASER/STENT PLACEMENT;  Surgeon: Hollice Espy, MD;  Location: [facility] ORS;  Service: Urology;  Laterality: Right;   DILATION AND CURETTAGE OF UTERUS     IR NEPHROSTOMY PLACEMENT RIGHT  01/17/2018   IR NEPHROSTOMY PLACEMENT RIGHT  02/27/2018   NEPHROLITHOTOMY Right 02/28/2018   Procedure: NEPHROLITHOTOMY PERCUTANEOUS;  Surgeon: Hollice Espy, MD;  Location: [facility];  Service: Urology;  Laterality: Right;   REPLACEMENT TOTAL KNEE BILATERAL     TEE WITHOUT CARDIOVERSION N/A 08/24/2017   Procedure: TRANSESOPHAGEAL ECHOCARDIOGRAM (TEE);  Surgeon: [name], MD;  Location: [facility];  Service: Cardiovascular;  Laterality: N/A;   TOTAL KNEE ARTHROPLASTY Bilateral    TOTAL VAGINAL HYSTERECTOMY N/A 1997    Home Medications:  Allergies as of 02/08/2022       Reactions   Cefuroxime Other (See Comments)   Patient states she has extreme abdominal pain and its not really effective for her.   Ace Inhibitors Itching   Codeine Sulfate Itching       Glucosamine    Latex Itching, Other (See Comments)   Gloves    Levofloxacin Other (See Comments)   Gastritis.  Onset 08/03/1999.   Shellfish-derived Products Other (See Comments)   Unknown, showed up on allergy test   Sulfa Antibiotics Itching        Medication List        Accurate as of February 08, 2022  3:55 PM. If you have any questions, ask your nurse or doctor.          STOP taking these medications    oxybutynin 5 MG tablet Commonly known as: DITROPAN Stopped by: Hollice Espy, MD   oxyCODONE-acetaminophen 5-325 MG tablet Commonly known as: Percocet Stopped by: Hollice Espy, MD   tamsulosin 0.4 MG Caps capsule Commonly known as: Flomax Stopped by: Hollice Espy, MD       TAKE these medications    acetaminophen 500 MG tablet Commonly known as:  TYLENOL Take 500 mg by mouth every 8 (eight) hours as needed for mild pain.   amiodarone 200 MG tablet Commonly known as: PACERONE Take 200 mg by mouth daily.   amLODipine 10 MG tablet Commonly known as: NORVASC Take 10 mg by mouth daily.   cetirizine 10 MG tablet Commonly known as: ZYRTEC Take 10 mg by mouth daily.   estradiol 0.5 MG tablet Commonly known as: ESTRACE Take 0.5 mg by mouth daily.   Gemtesa 75 MG Tabs Generic drug: Vibegron Take 75 mg by mouth daily. Started by: Hollice Espy, MD   metoprolol tartrate 50 MG tablet Commonly known as: LOPRESSOR Take 50 mg by mouth 2 (two) times daily.   valsartan 320 MG tablet Commonly known as: DIOVAN Take 320 mg by mouth daily.        Allergies:  Allergies  Allergen Reactions   Cefuroxime Other (See Comments)    Patient states she has extreme abdominal pain and its not really effective for her.   Ace Inhibitors Itching   Codeine Sulfate Itching         Glucosamine    Latex Itching and Other (See Comments)    Gloves    Levofloxacin Other (See Comments)    Gastritis.  Onset 08/03/1999.   Shellfish-Derived Products Other (See Comments)    Unknown, showed up on allergy test   Sulfa Antibiotics Itching    Family History: Family History  Problem Relation Age of Onset   Breast cancer Daughter 107    Social History:  reports that she has never smoked. She has never used smokeless tobacco. She reports that she does not drink alcohol and does not use drugs.   Physical Exam: BP (!) 152/81   Pulse 93   Ht '5\' 3"'$  (1.6 m)   Wt 218 lb (98.9 kg)   BMI 38.62 kg/m   Constitutional:  Alert and oriented, No acute distress. HEENT: [place] AT, moist mucus membranes.  Trachea midline, no masses. Cardiovascular: No clubbing, cyanosis, or edema. Respiratory: Normal respiratory effort, no increased work of breathing. Skin: No rashes, bruises or suspicious lesions. Neurologic: Grossly intact, no focal deficits, moving all 4  extremities. Psychiatric: Normal mood and affect.  Laboratory Data:  Lab Results  Component Value Date   CREATININE 1.03 (H) 12/30/2021   No results found for: "HGBA1C"  Urinalysis   Pertinent Imaging: CLINICAL DATA:  Nephrolithiasis.   EXAM: RENAL / URINARY TRACT ULTRASOUND COMPLETE   COMPARISON:  CT scan of the abdomen and pelvis Nov 11, 2021   FINDINGS: Right Kidney:   Renal measurements: 9.5 x 4.9 x 4.8 cm = volume: 117 mL. 7.2 mm nonobstructive stone is identified in the lower pole the right kidney.   Left Kidney:   Renal measurements: 10.8 x 5.9 x 5.9 cm = volume: 194. Or mL. Echogenicity within normal limits. No mass or hydronephrosis visualized.   Bladder:   Appears normal for degree of bladder distention.   Other:   None.   IMPRESSION: There is a 7.2 mm stone in the lower pole of the right kidney without hydronephrosis. The left kidney and bladder are unremarkable.     Electronically Signed   By: Dorise Bullion III M.D.   On: 01/31/2022 18:48   Assessment & Plan:    1. Recurrent UTI Urinalysis today consistent with recent stone procedure, no overtly suspicious for UTI and her symptoms are equivocal  Repeat urine culture today as a precaution, treat as needed  We discussed more specific UTI symptoms including dysuria, fevers, chills, or significant worsening.  In that situation, she was advised to call us for treatment. - Urinalysis, Complete - CULTURE, URINE COMPREHENSIVE - Abdomen 1 view (KUB); Future  2. Right nephrolithiasis Calcification in right lower pole and renal ultrasound which was personally reviewed today consistent with intraparenchymal stone disease likely status post PCNL  3. OAB (overactive bladder) Previously on Myrbetriq, not cost effective for the amount of symptom relief  Trial of Gemtesa today, 75 mg x 1 month.  She was advised to let us know how this works.  In addition, we discussed about other alternative  options for refractory urinary symptoms including Botox and PTNS.  She will let us know if she is interested in discussing these further.  F/u  1 year with KUB or sooner as needed    [employer]  as a scribe for Hollice Espy, MD.,have documented all relevant documentation on the behalf of Hollice Espy, MD,as directed by  Hollice Espy, MD while in the presence of Hollice Espy, MD.  I have reviewed the above documentation for accuracy and completeness, and I agree with the above.   Hollice Espy, MD  [facility] [address], [place], [place] [address] [phone]

## 2022-02-11 ENCOUNTER — Telehealth: Payer: Self-pay

## 2022-02-11 LAB — CULTURE, URINE COMPREHENSIVE

## 2022-02-11 MED ORDER — NITROFURANTOIN MONOHYD MACRO 100 MG PO CAPS: 100.0000 mg | ORAL_CAPSULE | Freq: Two times a day (BID) | ORAL | 0 refills | Status: DC

## 2022-02-11 NOTE — Telephone Encounter (Signed)
Per Dr. Erlene Quan urine culture positive treatment macrobid 100 bid x 10 days  Left patient a detailed message with results and sent a mychart message as well. Medication sent into pharmacy

## 2022-03-07 NOTE — Telephone Encounter (Signed)
error 

## 2022-03-29 ENCOUNTER — Other Ambulatory Visit: Payer: Self-pay | Admitting: Internal Medicine

## 2022-03-29 DIAGNOSIS — Z1231 Encounter for screening mammogram for malignant neoplasm of breast: Secondary | ICD-10-CM

## 2022-04-29 ENCOUNTER — Ambulatory Visit
Admission: RE | Admit: 2022-04-29 | Discharge: 2022-04-29 | Disposition: A | Discharge status: Home / Self Care | Payer: Medicare Other | Source: Ambulatory Visit | Attending: Internal Medicine | Admitting: Internal Medicine

## 2022-04-29 DIAGNOSIS — Z1231 Encounter for screening mammogram for malignant neoplasm of breast: Secondary | ICD-10-CM | POA: Insufficient documentation

## 2023-01-19 ENCOUNTER — Other Ambulatory Visit: Payer: Self-pay | Admitting: *Deleted

## 2023-01-19 DIAGNOSIS — N200 Calculus of kidney: Secondary | ICD-10-CM

## 2023-02-10 ENCOUNTER — Ambulatory Visit: Payer: Medicare Other | Admitting: Physician Assistant

## 2023-02-13 ENCOUNTER — Ambulatory Visit
Admission: RE | Admit: 2023-02-13 | Discharge: 2023-02-13 | Disposition: A | Discharge status: Home / Self Care | Payer: Medicare Other | Source: Ambulatory Visit | Attending: Physician Assistant | Admitting: Physician Assistant

## 2023-02-13 ENCOUNTER — Ambulatory Visit: Payer: Medicare Other | Admitting: Physician Assistant

## 2023-02-13 ENCOUNTER — Ambulatory Visit
Admission: RE | Admit: 2023-02-13 | Discharge: 2023-02-13 | Disposition: A | Discharge status: Home / Self Care | Payer: Medicare Other | Attending: Physician Assistant | Admitting: Physician Assistant

## 2023-02-13 ENCOUNTER — Encounter: Payer: Self-pay | Admitting: Physician Assistant

## 2023-02-13 VITALS — BP 131/85 | HR 73 | Wt 215.0 lb

## 2023-02-13 DIAGNOSIS — N3281 Overactive bladder: Secondary | ICD-10-CM | POA: Diagnosis not present

## 2023-02-13 DIAGNOSIS — N200 Calculus of kidney: Secondary | ICD-10-CM

## 2023-02-13 DIAGNOSIS — R8271 Bacteriuria: Secondary | ICD-10-CM | POA: Diagnosis not present

## 2023-02-13 LAB — MICROSCOPIC EXAMINATION

## 2023-02-13 LAB — URINALYSIS, COMPLETE
Bilirubin, UA: NEGATIVE
Glucose, UA: NEGATIVE
Ketones, UA: NEGATIVE
Nitrite, UA: NEGATIVE
Protein,UA: NEGATIVE
RBC, UA: NEGATIVE
Specific Gravity, UA: <1.005 — ABNORMAL LOW (ref 1.005–1.030)
Urobilinogen, Ur: 0.2 mg/dL (ref 0.2–1.0)
pH, UA: 5.5 (ref 5.0–7.5)

## 2023-02-15 NOTE — Progress Notes (Signed)
02/13/2023 9:35 AM   Emily Velazquez 08/25/1950 [id number]  CC: Chief Complaint  Patient presents with   kub   HPI: Emily Velazquez is a 72 y.o. female with PMH nephrolithiasis, OAB, and bacteriuria who presents today for annual follow-up.   Today she reports no acute concerns.  No stone episodes or UTIs in the past year.  Her urgency, frequency every 2 hours, nocturia x 2-3, and urge incontinence are stable.  Urge incontinence is worse at night and she wears pads with small-volume leaks.  She is on a diuretic.  Overall she is minimally bothered by her OAB symptoms.  KUB today with stable right lower pole intraparenchymal stone.  In-office UA today positive for 1+ leukocytes; urine microscopy with 11-30 WBCs/HPF and many bacteria.  PMH: Past Medical History:  Diagnosis Date   Anginal pain (HCC)    Aortic atherosclerosis (HCC)    Arthritis    B12 deficiency    Basal cell carcinoma    Bradycardia    Cataract    a.) s/p extraction   CKD (chronic kidney disease), stage III (HCC)    Edema    Gout    Graves disease    a.) s/p remote Tx with PTU   Heart murmur    Hepatic steatosis    History of kidney stones    HLD (hyperlipidemia)    Hypertension    IBS (irritable bowel syndrome)    Kidney stones    Low vitamin B12 level    OAB (overactive bladder)    PAF (paroxysmal atrial fibrillation) (HCC)    a.) CHA2DS2-VASc = 4 (age, sex, HTN, aortic plaque);  b.) s/p DCCV 05/23/2017 (150 J x 2); DCCV 06/22/2017 aborted d/t spontaneous conversion; PVI ablation 10/25/2017; c.) rate/rhythm maintained on oral amiodarone + metoprolol tartrate; no chronic anticoagulation   Pneumonia    Pre-diabetes    Rosacea    Short gut syndrome    Valvular heart disease 04/11/2017   a.) TTE 04/11/2017: EF 50%; mild TR, mod MR; b.) TTE 08/13/2017: EF 55-60%, mod TR, sev MR; c.) TEE 08/24/2017: EF 50-55%, triv TR, mod MR; d.) TTE 07/17/2019: EF >55%, triv PR, mod TR, sev MR; e.) TTE 07/19/2021: EF  >55%, mild MR/TR   Vocal cord dysfunction     Surgical History: Past Surgical History:  Procedure Laterality Date   APPENDECTOMY     ATRIAL FIBRILLATION ABLATION N/A 10/25/2017   Procedure: PVI ABLATION   CARDIOVERSION N/A 05/23/2017   Procedure: CARDIOVERSION;  Surgeon: Lamar Blinks, MD;  Location: [facility] OR[facility];  Service: Cardiovascular;  Laterality: N/A;   CARDIOVERSION N/A 06/22/2017   Procedure: CARDIOVERSION;  Surgeon: Lamar Blinks, MD;  Location: [facility];  Service: Cardiovascular;  Laterality: N/A;   CATARACT EXTRACTION W/PHACO Right 10/26/2016   Procedure: CATARACT EXTRACTION PHACO AND INTRAOCULAR LENS PLACEMENT ([place]) suture placed in right eye at end of procedure;  Surgeon: Sallee Lange, MD;  Location: [facility] OR[facility];  Service: Ophthalmology;  Laterality: Right;  [place]  01:21 AP% 23.7 CDE 36.24 Fluid pack lot # [id number]   COLON SURGERY     RESECTION 2 ft sm/ 2 large intestine   COLONOSCOPY WITH PROPOFOL N/A 03/18/2021   Procedure: COLONOSCOPY WITH PROPOFOL;  Surgeon: Jaynie Collins, DO;  Location: [facility] ENDOSCOPY;  Service: Gastroenterology;  Laterality: N/A;   CYSTOSCOPY/URETEROSCOPY/HOLMIUM LASER/STENT PLACEMENT Right 01/03/2022   Procedure: CYSTOSCOPY/URETEROSCOPY/HOLMIUM LASER/STENT PLACEMENT;  Surgeon: Vanna Scotland, MD;  Location: [facility];  Service: Urology;  Laterality: Right;   DILATION  AND CURETTAGE OF UTERUS     IR NEPHROSTOMY PLACEMENT RIGHT  01/17/2018   IR NEPHROSTOMY PLACEMENT RIGHT  02/27/2018   NEPHROLITHOTOMY Right 02/28/2018   Procedure: NEPHROLITHOTOMY PERCUTANEOUS;  Surgeon: Vanna Scotland, MD;  Location: [facility];  Service: Urology;  Laterality: Right;   REPLACEMENT TOTAL KNEE BILATERAL     TEE WITHOUT CARDIOVERSION N/A 08/24/2017   Procedure: TRANSESOPHAGEAL ECHOCARDIOGRAM (TEE);  Surgeon: Lamar Blinks, MD;  Location: [facility];  Service: Cardiovascular;  Laterality: N/A;   TOTAL KNEE ARTHROPLASTY Bilateral    TOTAL VAGINAL HYSTERECTOMY  N/A 1997    Home Medications:  Allergies as of 02/13/2023       Reactions   Cefuroxime Other (See Comments)   Patient states she has extreme abdominal pain and its not really effective for her.   Ace Inhibitors Itching   Codeine Sulfate Itching       Glucosamine    Latex Itching, Other (See Comments)   Gloves    Levofloxacin Other (See Comments)   Gastritis.  Onset 08/03/1999.   Shellfish-derived Products Other (See Comments)   Unknown, showed up on allergy test   Sulfa Antibiotics Itching        Medication List        Accurate as of February 13, 2023 11:59 PM. If you have any questions, ask your nurse or doctor.          STOP taking these medications    amiodarone 200 MG tablet Commonly known as: PACERONE Stopped by: Carman Ching   Gemtesa 75 MG Tabs Generic drug: Vibegron Stopped by: Carman Ching   nitrofurantoin (macrocrystal-monohydrate) 100 MG capsule Commonly known as: MACROBID Stopped by: Carman Ching       TAKE these medications    acetaminophen 500 MG tablet Commonly known as: TYLENOL Take 500 mg by mouth every 8 (eight) hours as needed for mild pain.   amLODipine 10 MG tablet Commonly known as: NORVASC Take 10 mg by mouth daily.   cetirizine 10 MG tablet Commonly known as: ZYRTEC Take 10 mg by mouth daily.   estradiol 0.5 MG tablet Commonly known as: ESTRACE Take 0.5 mg by mouth daily.   hydrochlorothiazide 12.5 MG capsule Commonly known as: MICROZIDE Take 12.5 mg by mouth daily.   metoprolol tartrate 50 MG tablet Commonly known as: LOPRESSOR Take 50 mg by mouth 2 (two) times daily.   valsartan 320 MG tablet Commonly known as: DIOVAN Take 320 mg by mouth daily.        Allergies:  Allergies  Allergen Reactions   Cefuroxime Other (See Comments)    Patient states she has extreme abdominal pain and its not really effective for her.   Ace Inhibitors Itching   Codeine Sulfate Itching          Glucosamine    Latex Itching and Other (See Comments)    Gloves    Levofloxacin Other (See Comments)    Gastritis.  Onset 08/03/1999.   Shellfish-Derived Products Other (See Comments)    Unknown, showed up on allergy test   Sulfa Antibiotics Itching    Family History: Family History  Problem Relation Age of Onset   Breast cancer Daughter 35    Social History:   reports that she has never smoked. She has never used smokeless tobacco. She reports that she does not drink alcohol and does not use drugs.  Physical Exam: BP 131/85   Pulse 73   Wt 215 lb (97.5 kg)   BMI 38.09 kg/m  Constitutional:  Alert and oriented, no acute distress, nontoxic appearing HEENT: [place], AT Cardiovascular: No clubbing, cyanosis, or edema Respiratory: Normal respiratory effort, no increased work of breathing Skin: No rashes, bruises or suspicious lesions Neurologic: Grossly intact, no focal deficits, moving all 4 extremities Psychiatric: Normal mood and affect  Laboratory Data: Results for orders placed or performed in visit on 02/13/23  Microscopic Examination   Urine  Result Value Ref Range   WBC, UA 11-30 (A) 0 - 5 /hpf   RBC, Urine 0-2 0 - 2 /hpf   Epithelial Cells (non renal) 0-10 0 - 10 /hpf   Bacteria, UA Many (A) None seen/Few  Urinalysis, Complete  Result Value Ref Range   Specific Gravity, UA <1.005 (L) 1.005 - 1.030   pH, UA 5.5 5.0 - 7.5   Color, UA Yellow Yellow   Appearance Ur Clear Clear   Leukocytes,UA 1+ (A) Negative   Protein,UA Negative Negative/Trace   Glucose, UA Negative Negative   Ketones, UA Negative Negative   RBC, UA Negative Negative   Bilirubin, UA Negative Negative   Urobilinogen, Ur 0.2 0.2 - 1.0 mg/dL   Nitrite, UA Negative Negative   Microscopic Examination See below:    Pertinent Imaging: KUB, 02/13/2023: CLINICAL DATA:  Follow up nephrolithiasis.   EXAM: ABDOMEN - 1 VIEW   COMPARISON:  10/27/2021.   FINDINGS: The bowel gas pattern is normal. The  largest stone overlying the right kidney on prior study is no longer seen. There is evidence of persistent right-sided nephrolithiasis with a couple of stones measuring 3-4 mm.   IMPRESSION: Right-sided nephrolithiasis. Largest stone on the right no longer seen.     Electronically Signed   By: Layla Maw M.D.   On: 02/19/2023 00:32  I personally reviewed the images referenced above and note a stable right lower pole intraparenchymal stone.  Assessment & Plan:   1. Nephrolithiasis Stable on KUB today, no acute stone episodes or flank pain in the past year.  Will continue to monitor. - Abdomen 1 view (KUB); Future  2. OAB (overactive bladder) Stable, minimally bothersome.  I offered her pharmacotherapy today but she declined.  Will continue to monitor.  3. Asymptomatic bacteriuria UA today with pyuria and bacteriuria, typical for her at baseline.  She denies dysuria.  We discussed asymptomatic bacteriuria and she is in agreement with deferring pharmacotherapy unless she develops irritative voiding symptoms. - Urinalysis, Complete  Return in about 1 year (around 02/13/2024) for Annual f/u with KUB prior with Dr. Apolinar Junes.  Carman Ching, PA-C  [facility] [place] [address] [place], [place] [address] [phone]

## 2023-02-28 ENCOUNTER — Encounter: Payer: Self-pay | Admitting: Physician Assistant

## 2023-02-28 ENCOUNTER — Ambulatory Visit: Payer: Medicare Other | Admitting: Physician Assistant

## 2023-02-28 VITALS — BP 144/79 | HR 85 | Ht 63.0 in | Wt 218.0 lb

## 2023-02-28 DIAGNOSIS — R102 Pelvic and perineal pain: Secondary | ICD-10-CM

## 2023-02-28 DIAGNOSIS — R3915 Urgency of urination: Secondary | ICD-10-CM

## 2023-02-28 DIAGNOSIS — R350 Frequency of micturition: Secondary | ICD-10-CM | POA: Diagnosis not present

## 2023-02-28 DIAGNOSIS — M545 Low back pain, unspecified: Secondary | ICD-10-CM | POA: Diagnosis not present

## 2023-02-28 DIAGNOSIS — N390 Urinary tract infection, site not specified: Secondary | ICD-10-CM

## 2023-02-28 LAB — URINALYSIS, COMPLETE
Bilirubin, UA: NEGATIVE
Glucose, UA: NEGATIVE
Ketones, UA: NEGATIVE
Nitrite, UA: NEGATIVE
Protein,UA: NEGATIVE
RBC, UA: NEGATIVE
Specific Gravity, UA: 1.02 (ref 1.005–1.030)
Urobilinogen, Ur: 0.2 mg/dL (ref 0.2–1.0)
pH, UA: 5.5 (ref 5.0–7.5)

## 2023-02-28 LAB — MICROSCOPIC EXAMINATION: WBC, UA: >30 /HPF — AB (ref 0–5)

## 2023-02-28 MED ORDER — CEPHALEXIN 500 MG PO CAPS: 500.0000 mg | ORAL_CAPSULE | Freq: Two times a day (BID) | ORAL | 0 refills | Status: AC

## 2023-02-28 NOTE — Progress Notes (Signed)
02/28/2023 2:28 PM   Emily Velazquez 1950-11-12 [id number]  CC: Chief Complaint  Patient presents with   Nephrolithiasis   HPI: Emily Velazquez is a 72 y.o. female with PMH cyst, OAB, and bacteriuria who presents today for evaluation of possible UTI.   Today she reports 3 days of low back and pelvic pain, urinary urgency, and frequency.  She has been taking ibuprofen and Tylenol.  She has been afebrile but reports some nausea/anorexia.  No unilateral flank pain.  She states her symptoms are inconsistent with past stone episodes.  In-office UA today positive for trace intact blood and 2+ leukocytes; urine microscopy with >30 WBCs/HPF and many bacteria.   PMH: Past Medical History:  Diagnosis Date   Anginal pain (HCC)    Aortic atherosclerosis (HCC)    Arthritis    B12 deficiency    Basal cell carcinoma    Bradycardia    Cataract    a.) s/p extraction   CKD (chronic kidney disease), stage III (HCC)    Edema    Gout    Graves disease    a.) s/p remote Tx with PTU   Heart murmur    Hepatic steatosis    History of kidney stones    HLD (hyperlipidemia)    Hypertension    IBS (irritable bowel syndrome)    Kidney stones    Low vitamin B12 level    OAB (overactive bladder)    PAF (paroxysmal atrial fibrillation) (HCC)    a.) CHA2DS2-VASc = 4 (age, sex, HTN, aortic plaque);  b.) s/p DCCV 05/23/2017 (150 J x 2); DCCV 06/22/2017 aborted d/t spontaneous conversion; PVI ablation 10/25/2017; c.) rate/rhythm maintained on oral amiodarone + metoprolol tartrate; no chronic anticoagulation   Pneumonia    Pre-diabetes    Rosacea    Short gut syndrome    Valvular heart disease 04/11/2017   a.) TTE 04/11/2017: EF 50%; mild TR, mod MR; b.) TTE 08/13/2017: EF 55-60%, mod TR, sev MR; c.) TEE 08/24/2017: EF 50-55%, triv TR, mod MR; d.) TTE 07/17/2019: EF >55%, triv PR, mod TR, sev MR; e.) TTE 07/19/2021: EF >55%, mild MR/TR   Vocal cord dysfunction     Surgical History: Past Surgical  History:  Procedure Laterality Date   APPENDECTOMY     ATRIAL FIBRILLATION ABLATION N/A 10/25/2017   Procedure: PVI ABLATION   CARDIOVERSION N/A 05/23/2017   Procedure: CARDIOVERSION;  Surgeon: Lamar Blinks, MD;  Location: [facility];  Service: Cardiovascular;  Laterality: N/A;   CARDIOVERSION N/A 06/22/2017   Procedure: CARDIOVERSION;  Surgeon: Lamar Blinks, MD;  Location: [facility];  Service: Cardiovascular;  Laterality: N/A;   CATARACT EXTRACTION W/PHACO Right 10/26/2016   Procedure: CATARACT EXTRACTION PHACO AND INTRAOCULAR LENS PLACEMENT (IOC) suture placed in right eye at end of procedure;  Surgeon: Sallee Lange, MD;  Location: [facility];  Service: Ophthalmology;  Laterality: Right;  [place]  01:21 AP% 23.7 CDE 36.24 Fluid pack lot # [id number]   COLON SURGERY     RESECTION 2 ft sm/ 2 large intestine   COLONOSCOPY WITH PROPOFOL N/A 03/18/2021   Procedure: COLONOSCOPY WITH PROPOFOL;  Surgeon: Jaynie Collins, DO;  [facility];  Service: Gastroenterology;  Laterality: N/A;   CYSTOSCOPY/URETEROSCOPY/HOLMIUM LASER/STENT PLACEMENT Right 01/03/2022   Procedure: CYSTOSCOPY/URETEROSCOPY/HOLMIUM LASER/STENT PLACEMENT;  Surgeon: Vanna Scotland, MD;  Location: [facility];  Service: Urology;  Laterality: Right;   DILATION AND CURETTAGE OF UTERUS     IR NEPHROSTOMY PLACEMENT RIGHT  01/17/2018  IR NEPHROSTOMY PLACEMENT RIGHT  02/27/2018   NEPHROLITHOTOMY Right 02/28/2018   Procedure: NEPHROLITHOTOMY PERCUTANEOUS;  Surgeon: Vanna Scotland, MD;  Location: [facility];  Service: Urology;  Laterality: Right;   REPLACEMENT TOTAL KNEE BILATERAL     TEE WITHOUT CARDIOVERSION N/A 08/24/2017   Procedure: TRANSESOPHAGEAL ECHOCARDIOGRAM (TEE);  Surgeon: Lamar Blinks, MD;  Location: [facility];  Service: Cardiovascular;  Laterality: N/A;   TOTAL KNEE ARTHROPLASTY Bilateral    TOTAL VAGINAL HYSTERECTOMY N/A 1997    Home Medications:  Allergies as of 02/28/2023       Reactions    Cefuroxime Other (See Comments)   Patient states she has extreme abdominal pain and its not really effective for her.   Ace Inhibitors Itching   Codeine Sulfate Itching       Glucosamine    Latex Itching, Other (See Comments)   Gloves    Levofloxacin Other (See Comments)   Gastritis.  Onset 08/03/1999.   Shellfish-derived Products Other (See Comments)   Unknown, showed up on allergy test   Sulfa Antibiotics Itching        Medication List        Accurate as of February 28, 2023  2:28 PM. If you have any questions, ask your nurse or doctor.          acetaminophen 500 MG tablet Commonly known as: TYLENOL Take 500 mg by mouth every 8 (eight) hours as needed for mild pain.   amLODipine 10 MG tablet Commonly known as: NORVASC Take 10 mg by mouth daily.   cephALEXin 500 MG capsule Commonly known as: Keflex Take 1 capsule (500 mg total) by mouth 2 (two) times daily for 5 days. Started by: Carman Ching   cetirizine 10 MG tablet Commonly known as: ZYRTEC Take 10 mg by mouth daily.   cyanocobalamin 1000 MCG/ML injection Commonly known as: VITAMIN B12 Inject into the muscle.   estradiol 0.5 MG tablet Commonly known as: ESTRACE Take 0.5 mg by mouth daily.   hydrochlorothiazide 12.5 MG capsule Commonly known as: MICROZIDE Take 12.5 mg by mouth daily.   metoprolol tartrate 50 MG tablet Commonly known as: LOPRESSOR Take 50 mg by mouth 2 (two) times daily.   valsartan 320 MG tablet Commonly known as: DIOVAN Take 320 mg by mouth daily.        Allergies:  Allergies  Allergen Reactions   Cefuroxime Other (See Comments)    Patient states she has extreme abdominal pain and its not really effective for her.   Ace Inhibitors Itching   Codeine Sulfate Itching         Glucosamine    Latex Itching and Other (See Comments)    Gloves    Levofloxacin Other (See Comments)    Gastritis.  Onset 08/03/1999.   Shellfish-Derived Products Other (See Comments)     Unknown, showed up on allergy test   Sulfa Antibiotics Itching    Family History: Family History  Problem Relation Age of Onset   Breast cancer Daughter 38    Social History:   reports that she has never smoked. She has never used smokeless tobacco. She reports that she does not drink alcohol and does not use drugs.  Physical Exam: BP (!) 144/79   Pulse 85   Ht 5\' 3"  (1.6 m)   Wt 218 lb (98.9 kg)   BMI 38.62 kg/m   Constitutional:  Alert and oriented, no acute distress, nontoxic appearing HEENT: [place], AT Cardiovascular: No clubbing, cyanosis, or edema Respiratory: Normal  respiratory effort, no increased work of breathing Skin: No rashes, bruises or suspicious lesions Neurologic: Grossly intact, no focal deficits, moving all 4 extremities Psychiatric: Normal mood and affect  Laboratory Data: Results for orders placed or performed in visit on 02/28/23  Microscopic Examination   Urine  Result Value Ref Range   WBC, UA >30 (A) 0 - 5 /hpf   RBC, Urine 0-2 0 - 2 /hpf   Epithelial Cells (non renal) 0-10 0 - 10 /hpf   Bacteria, UA Many (A) None seen/Few  Urinalysis, Complete  Result Value Ref Range   Specific Gravity, UA 1.020 1.005 - 1.030   pH, UA 5.5 5.0 - 7.5   Color, UA Yellow Yellow   Appearance Ur Hazy (A) Clear   Leukocytes,UA 2+ (A) Negative   Protein,UA Negative Negative/Trace   Glucose, UA Negative Negative   Ketones, UA Negative Negative   RBC, UA Negative Negative   Bilirubin, UA Negative Negative   Urobilinogen, Ur 0.2 0.2 - 1.0 mg/dL   Nitrite, UA Negative Negative   Microscopic Examination See below:    Assessment & Plan:   1. Recurrent UTI UA appears grossly infected today, not atypical for her at baseline.  We previously deferred antibiotics because she was asymptomatic, however it would be appropriate to treat in the setting of new irritative voiding symptoms.  Will start empiric Keflex and send for culture for further evaluation. - Urinalysis,  Complete - CULTURE, URINE COMPREHENSIVE - cephALEXin (KEFLEX) 500 MG capsule; Take 1 capsule (500 mg total) by mouth 2 (two) times daily for 5 days.  Dispense: 10 capsule; Refill: 0  Return if symptoms worsen or fail to improve.  Carman Ching, PA-C  [facility] [place] [address] [place], [place] [address] [phone]

## 2023-03-03 LAB — CULTURE, URINE COMPREHENSIVE

## 2023-04-04 ENCOUNTER — Other Ambulatory Visit: Payer: Self-pay | Admitting: Internal Medicine

## 2023-04-04 DIAGNOSIS — Z1231 Encounter for screening mammogram for malignant neoplasm of breast: Secondary | ICD-10-CM

## 2023-05-02 ENCOUNTER — Ambulatory Visit
Admission: RE | Admit: 2023-05-02 | Discharge: 2023-05-02 | Disposition: A | Discharge status: Home / Self Care | Payer: Medicare Other | Source: Ambulatory Visit | Attending: Internal Medicine | Admitting: Internal Medicine

## 2023-05-02 DIAGNOSIS — Z1231 Encounter for screening mammogram for malignant neoplasm of breast: Secondary | ICD-10-CM | POA: Insufficient documentation

## 2023-10-10 ENCOUNTER — Ambulatory Visit: Admitting: Physician Assistant

## 2023-10-10 VITALS — BP 165/84 | HR 68 | Temp 97.7°F | Ht 63.0 in | Wt 218.0 lb

## 2023-10-10 DIAGNOSIS — N3001 Acute cystitis with hematuria: Secondary | ICD-10-CM | POA: Diagnosis not present

## 2023-10-10 LAB — URINALYSIS, COMPLETE
Bilirubin, UA: NEGATIVE
Glucose, UA: NEGATIVE
Ketones, UA: NEGATIVE
Nitrite, UA: NEGATIVE
Protein,UA: NEGATIVE
Specific Gravity, UA: >1.03 — ABNORMAL HIGH (ref 1.005–1.030)
Urobilinogen, Ur: 0.2 mg/dL (ref 0.2–1.0)
pH, UA: 5.5 (ref 5.0–7.5)

## 2023-10-10 LAB — MICROSCOPIC EXAMINATION: WBC, UA: >30 /HPF — AB (ref 0–5)

## 2023-10-10 MED ORDER — CEPHALEXIN 500 MG PO CAPS: 500.0000 mg | ORAL_CAPSULE | Freq: Two times a day (BID) | ORAL | 0 refills | Status: AC

## 2023-10-10 NOTE — Progress Notes (Signed)
 10/10/2023 10:45 AM   Emily Velazquez 1950/10/05 [id number]  CC: Chief Complaint  Patient presents with   Recurrent UTI    HPI: Emily Velazquez is a 73 y.o. female with PMH nephrolithiasis, OAB, and chronic bacteriuria who presents today for evaluation of possible UTI.   Today she reports 2 weeks of dysuria, low back pain, urinary frequency, chills, body aches, and subjective fever.  She denies nausea, vomiting, or flank pain.  In-office UA today positive for trace intact blood and 2+ leukocytes; urine microscopy with >30 WBCs/HPF, 3-10 RBCs/HPF, and many bacteria.  PMH: Past Medical History:  Diagnosis Date   Anginal pain (HCC)    Aortic atherosclerosis (HCC)    Arthritis    B12 deficiency    Basal cell carcinoma    Bradycardia    Cataract    a.) s/p extraction   CKD (chronic kidney disease), stage III (HCC)    Edema    Gout    Graves disease    a.) s/p remote Tx with PTU   Heart murmur    Hepatic steatosis    History of kidney stones    HLD (hyperlipidemia)    Hypertension    IBS (irritable bowel syndrome)    Kidney stones    Low vitamin B12 level    OAB (overactive bladder)    PAF (paroxysmal atrial fibrillation) (HCC)    a.) CHA2DS2-VASc = 4 (age, sex, HTN, aortic plaque);  b.) s/p DCCV 05/23/2017 (150 J x 2); DCCV 06/22/2017 aborted d/t spontaneous conversion; PVI ablation 10/25/2017; c.) rate/rhythm maintained on oral amiodarone + metoprolol tartrate; no chronic anticoagulation   Pneumonia    Pre-diabetes    Rosacea    Short gut syndrome    Valvular heart disease 04/11/2017   a.) TTE 04/11/2017: EF 50%; mild TR, mod MR; b.) TTE 08/13/2017: EF 55-60%, mod TR, sev MR; c.) TEE 08/24/2017: EF 50-55%, triv TR, mod MR; d.) TTE 07/17/2019: EF >55%, triv PR, mod TR, sev MR; e.) TTE 07/19/2021: EF >55%, mild MR/TR   Vocal cord dysfunction     Surgical History: Past Surgical History:  Procedure Laterality Date   APPENDECTOMY     ATRIAL FIBRILLATION ABLATION N/A  10/25/2017   Procedure: PVI ABLATION   CARDIOVERSION N/A 05/23/2017   Procedure: CARDIOVERSION;  Surgeon: Lamar Blinks, MD;  Location: [facility];  Service: Cardiovascular;  Laterality: N/A;   CARDIOVERSION N/A 06/22/2017   Procedure: CARDIOVERSION;  Surgeon: Lamar Blinks, MD;  Location: [facility];  Service: Cardiovascular;  Laterality: N/A;   CATARACT EXTRACTION W/PHACO Right 10/26/2016   Procedure: CATARACT EXTRACTION PHACO AND INTRAOCULAR LENS PLACEMENT (IOC) suture placed in right eye at end of procedure;  Surgeon: Sallee Lange, MD;  Location: [facility];  Service: Ophthalmology;  Laterality: Right;  [place]  01:21 AP% 23.7 CDE 36.24 Fluid pack lot # [id number]   COLON SURGERY     RESECTION 2 ft sm/ 2 large intestine   COLONOSCOPY WITH PROPOFOL N/A 03/18/2021   Procedure: COLONOSCOPY WITH PROPOFOL;  Surgeon: Jaynie Collins, DO;  Location: [facility];  Service: Gastroenterology;  Laterality: N/A;   CYSTOSCOPY/URETEROSCOPY/HOLMIUM LASER/STENT PLACEMENT Right 01/03/2022   Procedure: CYSTOSCOPY/URETEROSCOPY/HOLMIUM LASER/STENT PLACEMENT;  Surgeon: Vanna Scotland, MD;  Location: [facility];  Service: Urology;  Laterality: Right;   DILATION AND CURETTAGE OF UTERUS     IR NEPHROSTOMY PLACEMENT RIGHT  01/17/2018   IR NEPHROSTOMY PLACEMENT RIGHT  02/27/2018   NEPHROLITHOTOMY Right 02/28/2018   Procedure: NEPHROLITHOTOMY PERCUTANEOUS;  Surgeon:  Dustin Gimenez, MD;  Location: [facility];  Service: Urology;  Laterality: Right;   REPLACEMENT TOTAL KNEE BILATERAL     TEE WITHOUT CARDIOVERSION N/A 08/24/2017   Procedure: TRANSESOPHAGEAL ECHOCARDIOGRAM (TEE);  Surgeon: Michelle Aid, MD;  Location: [facility];  Service: Cardiovascular;  Laterality: N/A;   TOTAL KNEE ARTHROPLASTY Bilateral    TOTAL VAGINAL HYSTERECTOMY N/A 1997    Home Medications:  Allergies as of 10/10/2023       Reactions   Cefuroxime Other (See Comments)   Patient states she has extreme abdominal pain and its not  really effective for her.   Ace Inhibitors Itching   Codeine Sulfate Itching       Glucosamine    Latex Itching, Other (See Comments)   Gloves    Levofloxacin Other (See Comments)   Gastritis.  Onset 08/03/1999.   Shellfish-derived Products Other (See Comments)   Unknown, showed up on allergy test   Sulfa Antibiotics Itching        Medication List        Accurate as of October 10, 2023 10:45 AM. If you have any questions, ask your nurse or doctor.          acetaminophen 500 MG tablet Commonly known as: TYLENOL Take 500 mg by mouth every 8 (eight) hours as needed for mild pain.   amLODipine 10 MG tablet Commonly known as: NORVASC Take 10 mg by mouth daily.   cetirizine 10 MG tablet Commonly known as: ZYRTEC Take 10 mg by mouth daily.   cyanocobalamin 1000 MCG/ML injection Commonly known as: VITAMIN B12 Inject into the muscle.   estradiol 0.5 MG tablet Commonly known as: ESTRACE Take 0.5 mg by mouth daily.   hydrochlorothiazide 12.5 MG capsule Commonly known as: MICROZIDE Take 12.5 mg by mouth daily.   meloxicam 15 MG tablet Commonly known as: MOBIC Take 15 mg by mouth daily.   metoprolol tartrate 50 MG tablet Commonly known as: LOPRESSOR Take 50 mg by mouth 2 (two) times daily.   valsartan 320 MG tablet Commonly known as: DIOVAN Take 320 mg by mouth daily.        Allergies:  Allergies  Allergen Reactions   Cefuroxime Other (See Comments)    Patient states she has extreme abdominal pain and its not really effective for her.   Ace Inhibitors Itching   Codeine Sulfate Itching         Glucosamine    Latex Itching and Other (See Comments)    Gloves    Levofloxacin Other (See Comments)    Gastritis.  Onset 08/03/1999.   Shellfish-Derived Products Other (See Comments)    Unknown, showed up on allergy test   Sulfa Antibiotics Itching    Family History: Family History  Problem Relation Age of Onset   Breast cancer Daughter 65    Social  History:   reports that she has never smoked. She has never used smokeless tobacco. She reports that she does not drink alcohol and does not use drugs.  Physical Exam: BP (!) 165/84   Pulse 68   Ht 5\' 3"  (1.6 m)   Wt 218 lb (98.9 kg)   BMI 38.62 kg/m   Constitutional:  Alert and oriented, no acute distress, nontoxic appearing HEENT: [place], AT Cardiovascular: No clubbing, cyanosis, or edema Respiratory: Normal respiratory effort, no increased work of breathing Skin: No rashes, bruises or suspicious lesions Neurologic: Grossly intact, no focal deficits, moving all 4 extremities Psychiatric: Normal mood and affect  Laboratory Data:  Results for orders placed or performed in visit on 10/10/23  Microscopic Examination   Collection Time: 10/10/23 10:19 AM   Urine  Result Value Ref Range   WBC, UA >30 (A) 0 - 5 /hpf   RBC, Urine 3-10 (A) 0 - 2 /hpf   Epithelial Cells (non renal) 0-10 0 - 10 /hpf   Bacteria, UA Many (A) None seen/Few  Urinalysis, Complete   Collection Time: 10/10/23 10:19 AM  Result Value Ref Range   Specific Gravity, UA >1.030 (H) 1.005 - 1.030   pH, UA 5.5 5.0 - 7.5   Color, UA Yellow Yellow   Appearance Ur Clear Clear   Leukocytes,UA 2+ (A) Negative   Protein,UA Negative Negative/Trace   Glucose, UA Negative Negative   Ketones, UA Negative Negative   RBC, UA Trace (A) Negative   Bilirubin, UA Negative Negative   Urobilinogen, Ur 0.2 0.2 - 1.0 mg/dL   Nitrite, UA Negative Negative   Microscopic Examination See below:    Assessment & Plan:   1. Acute cystitis with hematuria (Primary) UA appears grossly infected, not atypical for her, though her symptoms are classic for acute cystitis with some systemic symptoms.  Fortunately, she is afebrile, VSS in clinic today.  Will start empiric Keflex and send for culture for further evaluation.  We discussed that if she spikes new fevers or has worsening symptoms including pain, will keep a low threshold for repeat imaging  given her stone history. - Urinalysis, Complete - CULTURE, URINE COMPREHENSIVE - cephALEXin (KEFLEX) 500 MG capsule; Take 1 capsule (500 mg total) by mouth 2 (two) times daily for 7 days.  Dispense: 14 capsule; Refill: 0   Return in about 1 week (around 10/17/2023) for Lab visit for UA.  Kathreen Pare, PA-C  [facility] [place] [address] [place], [place] [address] [phone]

## 2023-10-13 LAB — CULTURE, URINE COMPREHENSIVE

## 2023-10-16 ENCOUNTER — Other Ambulatory Visit: Payer: Self-pay | Admitting: Orthopedic Surgery

## 2023-10-16 DIAGNOSIS — Z96652 Presence of left artificial knee joint: Secondary | ICD-10-CM

## 2023-10-17 ENCOUNTER — Other Ambulatory Visit

## 2023-10-17 DIAGNOSIS — N3001 Acute cystitis with hematuria: Secondary | ICD-10-CM

## 2023-10-17 LAB — URINALYSIS, COMPLETE
Bilirubin, UA: NEGATIVE
Glucose, UA: NEGATIVE
Ketones, UA: NEGATIVE
Nitrite, UA: NEGATIVE
Protein,UA: NEGATIVE
RBC, UA: NEGATIVE
Specific Gravity, UA: 1.015 (ref 1.005–1.030)
Urobilinogen, Ur: 0.2 mg/dL (ref 0.2–1.0)
pH, UA: 5.5 (ref 5.0–7.5)

## 2023-10-17 LAB — MICROSCOPIC EXAMINATION: Epithelial Cells (non renal): >10 /HPF — AB (ref 0–10)

## 2023-10-19 NOTE — Telephone Encounter (Signed)
 Spoke with pt and read message from [place].

## 2023-10-23 LAB — CULTURE, URINE COMPREHENSIVE

## 2023-10-26 ENCOUNTER — Encounter
Admission: RE | Admit: 2023-10-26 | Discharge: 2023-10-26 | Disposition: A | Discharge status: Home / Self Care | Source: Ambulatory Visit | Attending: Orthopedic Surgery | Admitting: Orthopedic Surgery

## 2023-10-26 ENCOUNTER — Other Ambulatory Visit
Admission: RE | Admit: 2023-10-26 | Discharge: 2023-10-26 | Disposition: A | Discharge status: Home / Self Care | Source: Ambulatory Visit | Attending: Orthopedic Surgery | Admitting: Orthopedic Surgery

## 2023-10-26 DIAGNOSIS — Z96652 Presence of left artificial knee joint: Secondary | ICD-10-CM | POA: Insufficient documentation

## 2023-10-26 LAB — SEDIMENTATION RATE: Sed Rate: 9 mm/h (ref 0–30)

## 2023-10-26 MED ORDER — TECHNETIUM TC 99M MEDRONATE IV KIT
20.0000 | PACK | Freq: Once | INTRAVENOUS | Status: AC | PRN
  Administered 2023-10-26: 21.16 via INTRAVENOUS

## 2023-11-09 ENCOUNTER — Other Ambulatory Visit

## 2024-01-03 ENCOUNTER — Encounter: Payer: Self-pay | Admitting: Emergency Medicine

## 2024-01-03 ENCOUNTER — Ambulatory Visit
Admission: EM | Admit: 2024-01-03 | Discharge: 2024-01-03 | Disposition: A | Discharge status: Home / Self Care | Attending: Emergency Medicine | Admitting: Emergency Medicine

## 2024-01-03 DIAGNOSIS — J069 Acute upper respiratory infection, unspecified: Secondary | ICD-10-CM

## 2024-01-03 MED ORDER — BENZONATATE 100 MG PO CAPS: 100.0000 mg | ORAL_CAPSULE | Freq: Three times a day (TID) | ORAL | 0 refills | Status: AC

## 2024-01-03 MED ORDER — AZITHROMYCIN 250 MG PO TABS: 250.0000 mg | ORAL_TABLET | Freq: Every day | ORAL | 0 refills | Status: AC

## 2024-01-03 MED ORDER — PROMETHAZINE-DM 6.25-15 MG/5ML PO SYRP
5.0000 mL | ORAL_SOLUTION | Freq: Every evening | ORAL | 0 refills | Status: AC | PRN
Start: 2024-01-03 — End: ?

## 2024-01-03 NOTE — ED Provider Notes (Signed)
 Emily Velazquez    CSN: [id number] Arrival date & time: 01/03/24  1015      History   Chief Complaint Chief Complaint  Patient presents with   Cough   Nasal Congestion    HPI Emily Velazquez is a 73 y.o. female.   Patient presents for evaluation of intermittent headaches, sinus pressure to the forehead, productive cough, rhinorrhea, chest congestion present for 4 days.  Having a sleep in a chair at nighttime due to persistent coughing.  Has heard a crackling in the chest when lying flat.  No known sick contact prior.  Has not attempted treatment.  Tolerable to food and liquids.  Denies fever, shortness of breath or wheezing.  Past Medical History:  Diagnosis Date   Anginal pain (HCC)    Aortic atherosclerosis (HCC)    Arthritis    B12 deficiency    Basal cell carcinoma    Bradycardia    Cataract    a.) s/p extraction   CKD (chronic kidney disease), stage III (HCC)    Edema    Gout    Graves disease    a.) s/p remote Tx with PTU   Heart murmur    Hepatic steatosis    History of kidney stones    HLD (hyperlipidemia)    Hypertension    IBS (irritable bowel syndrome)    Kidney stones    Low vitamin B12 level    OAB (overactive bladder)    PAF (paroxysmal atrial fibrillation) (HCC)    a.) CHA2DS2-VASc = 4 (age, sex, HTN, aortic plaque);  b.) s/p DCCV 05/23/2017 (150 J x 2); DCCV 06/22/2017 aborted d/t spontaneous conversion; PVI ablation 10/25/2017; c.) rate/rhythm maintained on oral amiodarone  + metoprolol  tartrate; no chronic anticoagulation   Pneumonia    Pre-diabetes    Rosacea    Short gut syndrome    Valvular heart disease 04/11/2017   a.) TTE 04/11/2017: EF 50%; mild TR, mod MR; b.) TTE 08/13/2017: EF 55-60%, mod TR, sev MR; c.) TEE 08/24/2017: EF 50-55%, triv TR, mod MR; d.) TTE 07/17/2019: EF >55%, triv PR, mod TR, sev MR; e.) TTE 07/19/2021: EF >55%, mild MR/TR   Vocal cord dysfunction     Patient Active Problem List   Diagnosis Date Noted   Severe  sepsis (HCC) 01/17/2018   Right kidney stone 01/17/2018   Acute pyelonephritis 01/17/2018   HTN (hypertension) 01/17/2018   PAF (paroxysmal atrial fibrillation) (HCC) 01/17/2018   AKI (acute kidney injury) (HCC) 01/17/2018    Past Surgical History:  Procedure Laterality Date   APPENDECTOMY     ATRIAL FIBRILLATION ABLATION N/A 10/25/2017   Procedure: PVI ABLATION   CARDIOVERSION N/A 05/23/2017   Procedure: CARDIOVERSION;  Surgeon: Hester Wolm PARAS, MD;  Location: [facility];  Service: Cardiovascular;  Laterality: N/A;   CARDIOVERSION N/A 06/22/2017   Procedure: CARDIOVERSION;  Surgeon: Hester Wolm PARAS, MD;  Location: [facility];  Service: Cardiovascular;  Laterality: N/A;   CATARACT EXTRACTION W/PHACO Right 10/26/2016   Procedure: CATARACT EXTRACTION PHACO AND INTRAOCULAR LENS PLACEMENT (IOC) suture placed in right eye at end of procedure;  Surgeon: Steven Dingeldein, MD;  Location: [facility];  Service: Ophthalmology;  Laterality: Right;  US   01:21 AP% 23.7 CDE 36.24 Fluid pack lot # [id number]   COLON SURGERY     RESECTION 2 ft sm/ 2 large intestine   COLONOSCOPY WITH PROPOFOL  N/A 03/18/2021   Procedure: COLONOSCOPY WITH PROPOFOL ;  Surgeon: Onita Elspeth Sharper, DO;  Location: [facility];  Service:  Gastroenterology;  Laterality: N/A;   CYSTOSCOPY/URETEROSCOPY/HOLMIUM LASER/STENT PLACEMENT Right 01/03/2022   Procedure: CYSTOSCOPY/URETEROSCOPY/HOLMIUM LASER/STENT PLACEMENT;  Surgeon: Penne Knee, MD;  Location: [facility];  Service: Urology;  Laterality: Right;   DILATION AND CURETTAGE OF UTERUS     IR NEPHROSTOMY PLACEMENT RIGHT  01/17/2018   IR NEPHROSTOMY PLACEMENT RIGHT  02/27/2018   NEPHROLITHOTOMY Right 02/28/2018   Procedure: NEPHROLITHOTOMY PERCUTANEOUS;  Surgeon: Penne Knee, MD;  Location: [facility];  Service: Urology;  Laterality: Right;   REPLACEMENT TOTAL KNEE BILATERAL     TEE WITHOUT CARDIOVERSION N/A 08/24/2017   Procedure: TRANSESOPHAGEAL ECHOCARDIOGRAM (TEE);   Surgeon: Hester Wolm PARAS, MD;  Location: [facility];  Service: Cardiovascular;  Laterality: N/A;   TOTAL KNEE ARTHROPLASTY Bilateral    TOTAL VAGINAL HYSTERECTOMY N/A 1997    OB History   No obstetric history on file.      Home Medications    Prior to Admission medications   Medication Sig Start Date End Date Taking? Authorizing Provider  azithromycin  (ZITHROMAX ) 250 MG tablet Take 1 tablet (250 mg total) by mouth daily. Take first 2 tablets together, then 1 every day until finished. 01/06/24  Yes Agnes Brightbill R, NP  benzonatate  (TESSALON ) 100 MG capsule Take 1 capsule (100 mg total) by mouth every 8 (eight) hours. 01/03/24  Yes Ailah Barna, Shelba SAUNDERS, NP  promethazine -dextromethorphan (PROMETHAZINE -DM) 6.25-15 MG/5ML syrup Take 5 mLs by mouth at bedtime as needed. 01/03/24  Yes Chianti Goh R, NP  acetaminophen  (TYLENOL ) 500 MG tablet Take 500 mg by mouth every 8 (eight) hours as needed for mild pain.     [provider]  amLODipine (NORVASC) 10 MG tablet Take 10 mg by mouth daily. 09/29/21   [provider]  cetirizine (ZYRTEC) 10 MG tablet Take 10 mg by mouth daily.    [provider]  cyanocobalamin (VITAMIN B12) 1000 MCG/ML injection Inject into the muscle. 11/23/22   [provider]  estradiol (ESTRACE) 0.5 MG tablet Take 0.5 mg by mouth daily. 09/04/16   [provider]  hydrochlorothiazide (MICROZIDE) 12.5 MG capsule Take 12.5 mg by mouth daily.    [provider]  meloxicam (MOBIC) 15 MG tablet Take 15 mg by mouth daily. 10/05/23   [provider]  metoprolol  tartrate (LOPRESSOR ) 50 MG tablet Take 50 mg by mouth 2 (two) times daily.    [provider]  valsartan (DIOVAN) 320 MG tablet Take 320 mg by mouth daily. 10/11/21   [provider]    Family History Family History  Problem Relation Age of Onset   Breast cancer Daughter 50    Social History Social History   Tobacco Use   Smoking status: Never    Smokeless tobacco: Never  Vaping Use   Vaping status: Never Used  Substance Use Topics   Alcohol use: No   Drug use: No     Allergies   Cefuroxime , Ace inhibitors, Codeine sulfate, Glucosamine, Latex, Levofloxacin, Shellfish-derived products, and Sulfa antibiotics   Review of Systems Review of Systems   Physical Exam Triage Vital Signs ED Triage Vitals  Encounter Vitals Group     BP 01/03/24 1026 (!) 162/70     Girls Systolic BP Percentile --      Girls Diastolic BP Percentile --      Boys Systolic BP Percentile --      Boys Diastolic BP Percentile --      Pulse Rate 01/03/24 1032 74     Resp 01/03/24 1026 20  Temp 01/03/24 1026 98.5 F (36.9 C)     Temp Source 01/03/24 1026 Oral     SpO2 01/03/24 1026 96 %     Weight --      Height --      Head Circumference --      Peak Flow --      Pain Score 01/03/24 1028 0     Pain Loc --      Pain Education --      Exclude from Growth Chart --    No data found.  Updated Vital Signs BP (!) 144/80 (BP Location: Left Arm)   Pulse 74   Temp 98.5 F (36.9 C) (Oral)   Resp 20   SpO2 96%   Visual Acuity Right Eye Distance:   Left Eye Distance:   Bilateral Distance:    Right Eye Near:   Left Eye Near:    Bilateral Near:     Physical Exam Constitutional:      Appearance: Normal appearance.  HENT:     Head: Normocephalic.     Right Ear: Tympanic membrane, ear canal and external ear normal.     Left Ear: Tympanic membrane, ear canal and external ear normal.     Nose: Congestion present.     Mouth/Throat:     Mouth: Mucous membranes are moist.     Pharynx: Oropharynx is clear. No oropharyngeal exudate or posterior oropharyngeal erythema.  Eyes:     Extraocular Movements: Extraocular movements intact.  Cardiovascular:     Rate and Rhythm: Normal rate and regular rhythm.     Pulses: Normal pulses.     Heart sounds: Normal heart sounds.  Pulmonary:     Effort: Pulmonary effort is normal.     Breath sounds:  Normal breath sounds.  Musculoskeletal:     Cervical back: Normal range of motion and neck supple.  Neurological:     Mental Status: She is alert and oriented to person, place, and time. Mental status is at baseline.      UC Treatments / Results  Labs (all labs ordered are listed, but only abnormal results are displayed) Labs Reviewed - No data to display  EKG   Radiology No results found.  Procedures Procedures (including critical care time)  Medications Ordered in UC Medications - No data to display  Initial Impression / Assessment and Plan / UC Course  I have reviewed the triage vital signs and the nursing notes.  Pertinent labs & imaging results that were available during my care of the patient were reviewed by me and considered in my medical decision making (see chart for details).  Viral URI with cough  Patient is in no signs of distress nor toxic appearing.  Vital signs are stable.  Low suspicion for pneumonia, pneumothorax or bronchitis and therefore will defer imaging.  Etiology viral, present for 4 days, prescribed Tessalon  and Promethazine  DM, watch and wait antibiotic placed at pharmacy.May use additional over-the-counter medications as needed for supportive care.  May follow-up with urgent care as needed if symptoms persist or worsen.   Final Clinical Impressions(s) / UC Diagnoses   Final diagnoses:  Viral URI with cough     Discharge Instructions      Your symptoms today are most likely being caused by a virus and should steadily improve in time it can take up to 7 to 10 days before you truly start to see a turnaround however things will get better, if no improvement seen by Saturday  may pick up azithromycin  from the pharmacy  In the meantime you may use Tessalon  taking 1 to 2 tablets every 8 hours as needed for cough, may use cough syrup at bedtime to allow for you to rest    You can take Tylenol  and/or Ibuprofen as needed for fever reduction and pain  relief.   For cough: honey 1/2 to 1 teaspoon (you can dilute the honey in water or another fluid).  You can also use guaifenesin and dextromethorphan for cough. You can use a humidifier for chest congestion and cough.  If you don't have a humidifier, you can sit in the bathroom with the hot shower running.      For sore throat: try warm salt water gargles, cepacol lozenges, throat spray, warm tea or water with lemon/honey, popsicles or ice, or OTC cold relief medicine for throat discomfort.   For congestion: take a daily anti-histamine like Zyrtec, Claritin , and a oral decongestant, such as pseudoephedrine.  You can also use Flonase 1-2 sprays in each nostril daily.   It is important to stay hydrated: drink plenty of fluids (water, gatorade/powerade/pedialyte, juices, or teas) to keep your throat moisturized and help further relieve irritation/discomfort.    ED Prescriptions     Medication Sig Dispense Auth. Provider   benzonatate  (TESSALON ) 100 MG capsule Take 1 capsule (100 mg total) by mouth every 8 (eight) hours. 21 capsule Ilan Kahrs R, NP   promethazine -dextromethorphan (PROMETHAZINE -DM) 6.25-15 MG/5ML syrup Take 5 mLs by mouth at bedtime as needed. 118 mL Ryer Asato R, NP   azithromycin  (ZITHROMAX ) 250 MG tablet Take 1 tablet (250 mg total) by mouth daily. Take first 2 tablets together, then 1 every day until finished. 6 tablet Joetta Delprado R, NP      PDMP not reviewed this encounter.   Teresa Shelba SAUNDERS, NP 01/03/24 1052

## 2024-01-03 NOTE — Discharge Instructions (Signed)
 Your symptoms today are most likely being caused by a virus and should steadily improve in time it can take up to 7 to 10 days before you truly start to see a turnaround however things will get better, if no improvement seen by Saturday may pick up azithromycin  from the pharmacy  In the meantime you may use Tessalon  taking 1 to 2 tablets every 8 hours as needed for cough, may use cough syrup at bedtime to allow for you to rest    You can take Tylenol  and/or Ibuprofen as needed for fever reduction and pain relief.   For cough: honey 1/2 to 1 teaspoon (you can dilute the honey in water or another fluid).  You can also use guaifenesin and dextromethorphan for cough. You can use a humidifier for chest congestion and cough.  If you don't have a humidifier, you can sit in the bathroom with the hot shower running.      For sore throat: try warm salt water gargles, cepacol lozenges, throat spray, warm tea or water with lemon/honey, popsicles or ice, or OTC cold relief medicine for throat discomfort.   For congestion: take a daily anti-histamine like Zyrtec, Claritin , and a oral decongestant, such as pseudoephedrine.  You can also use Flonase 1-2 sprays in each nostril daily.   It is important to stay hydrated: drink plenty of fluids (water, gatorade/powerade/pedialyte, juices, or teas) to keep your throat moisturized and help further relieve irritation/discomfort.

## 2024-01-03 NOTE — ED Triage Notes (Signed)
 Cough, chest congestion and nasal congestion x 4 days. Patient has not taken anything for symptoms. Denies pain.

## 2024-02-13 ENCOUNTER — Ambulatory Visit
Admission: RE | Admit: 2024-02-13 | Discharge: 2024-02-13 | Disposition: A | Discharge status: Home / Self Care | Source: Ambulatory Visit | Attending: Physician Assistant | Admitting: Physician Assistant

## 2024-02-13 ENCOUNTER — Ambulatory Visit: Admitting: Physician Assistant

## 2024-02-13 ENCOUNTER — Ambulatory Visit: Payer: Self-pay | Admitting: Urology

## 2024-02-13 VITALS — BP 168/81 | HR 97 | Ht 62.0 in | Wt 218.8 lb

## 2024-02-13 DIAGNOSIS — N200 Calculus of kidney: Secondary | ICD-10-CM

## 2024-02-13 DIAGNOSIS — N3281 Overactive bladder: Secondary | ICD-10-CM

## 2024-02-13 DIAGNOSIS — Z87442 Personal history of urinary calculi: Secondary | ICD-10-CM | POA: Diagnosis not present

## 2024-02-13 DIAGNOSIS — N390 Urinary tract infection, site not specified: Secondary | ICD-10-CM

## 2024-02-13 LAB — MICROSCOPIC EXAMINATION: Epithelial Cells (non renal): >10 /HPF — AB (ref 0–10)

## 2024-02-13 LAB — URINALYSIS, COMPLETE
Bilirubin, UA: NEGATIVE
Glucose, UA: NEGATIVE
Ketones, UA: NEGATIVE
Nitrite, UA: NEGATIVE
RBC, UA: NEGATIVE
Specific Gravity, UA: 1.02 (ref 1.005–1.030)
Urobilinogen, Ur: 0.2 mg/dL (ref 0.2–1.0)
pH, UA: 6 (ref 5.0–7.5)

## 2024-02-13 NOTE — Progress Notes (Signed)
 02/13/2024 3:03 PM   Emily Velazquez 03/21/1951 [id number]  CC: Chief Complaint  Patient presents with   Follow-up   Nephrolithiasis   HPI: Emily Velazquez is a 73 y.o. female with PMH nephrolithiasis, OAB, and chronic bacteriuria who presents today for annual follow-up.   Today she reports no flank pain, gross hematuria, or stones passed in the last year.  She has noticed some increased frequency lately.  She was previously on Myrbetriq, but stopped it about 7 years ago because she did not feel it was helping much.  She is not terribly bothered by this.  Notably, she is scheduled for left knee revision next month.  KUB today with stable right renal stone.  In-office UA today positive for trace protein and trace leukocytes; urine microscopy with 6-10 WBCs/HPF, >10 epithelial cells/hpf, and few bacteria.  PMH: Past Medical History:  Diagnosis Date   Anginal pain (HCC)    Aortic atherosclerosis (HCC)    Arthritis    B12 deficiency    Basal cell carcinoma    Bradycardia    Cataract    a.) s/p extraction   CKD (chronic kidney disease), stage III (HCC)    Edema    Gout    Graves disease    a.) s/p remote Tx with PTU   Heart murmur    Hepatic steatosis    History of kidney stones    HLD (hyperlipidemia)    Hypertension    IBS (irritable bowel syndrome)    Kidney stones    Low vitamin B12 level    OAB (overactive bladder)    PAF (paroxysmal atrial fibrillation) (HCC)    a.) CHA2DS2-VASc = 4 (age, sex, HTN, aortic plaque);  b.) s/p DCCV 05/23/2017 (150 J x 2); DCCV 06/22/2017 aborted d/t spontaneous conversion; PVI ablation 10/25/2017; c.) rate/rhythm maintained on oral amiodarone  + metoprolol  tartrate; no chronic anticoagulation   Pneumonia    Pre-diabetes    Rosacea    Short gut syndrome    Valvular heart disease 04/11/2017   a.) TTE 04/11/2017: EF 50%; mild TR, mod MR; b.) TTE 08/13/2017: EF 55-60%, mod TR, sev MR; c.) TEE 08/24/2017: EF 50-55%, triv TR, mod MR; d.)  TTE 07/17/2019: EF >55%, triv PR, mod TR, sev MR; e.) TTE 07/19/2021: EF >55%, mild MR/TR   Vocal cord dysfunction     Surgical History: Past Surgical History:  Procedure Laterality Date   APPENDECTOMY     ATRIAL FIBRILLATION ABLATION N/A 10/25/2017   Procedure: PVI ABLATION   CARDIOVERSION N/A 05/23/2017   Procedure: CARDIOVERSION;  Surgeon: Hester Wolm PARAS, MD;  Location: [facility];  Service: Cardiovascular;  Laterality: N/A;   CARDIOVERSION N/A 06/22/2017   Procedure: CARDIOVERSION;  Surgeon: Hester Wolm PARAS, MD;  Location: [facility];  Service: Cardiovascular;  Laterality: N/A;   CATARACT EXTRACTION W/PHACO Right 10/26/2016   Procedure: CATARACT EXTRACTION PHACO AND INTRAOCULAR LENS PLACEMENT (IOC) suture placed in right eye at end of procedure;  Surgeon: Steven Dingeldein, MD;  Location: [facility];  Service: Ophthalmology;  Laterality: Right;  US   01:21 AP% 23.7 CDE 36.24 Fluid pack lot # [id number]   COLON SURGERY     RESECTION 2 ft sm/ 2 large intestine   COLONOSCOPY WITH PROPOFOL  N/A 03/18/2021   Procedure: COLONOSCOPY WITH PROPOFOL ;  Surgeon: Onita Elspeth Sharper, DO;  Location: [facility];  Service: Gastroenterology;  Laterality: N/A;   CYSTOSCOPY/URETEROSCOPY/HOLMIUM LASER/STENT PLACEMENT Right 01/03/2022   Procedure: CYSTOSCOPY/URETEROSCOPY/HOLMIUM LASER/STENT PLACEMENT;  Surgeon: Penne Knee, MD;  Location:  [facility];  Service: Urology;  Laterality: Right;   DILATION AND CURETTAGE OF UTERUS     IR NEPHROSTOMY PLACEMENT RIGHT  01/17/2018   IR NEPHROSTOMY PLACEMENT RIGHT  02/27/2018   NEPHROLITHOTOMY Right 02/28/2018   Procedure: NEPHROLITHOTOMY PERCUTANEOUS;  Surgeon: Penne Knee, MD;  Location: [facility];  Service: Urology;  Laterality: Right;   REPLACEMENT TOTAL KNEE BILATERAL     TEE WITHOUT CARDIOVERSION N/A 08/24/2017   Procedure: TRANSESOPHAGEAL ECHOCARDIOGRAM (TEE);  Surgeon: Hester Wolm PARAS, MD;  Location: [facility];  Service: Cardiovascular;  Laterality:  N/A;   TOTAL KNEE ARTHROPLASTY Bilateral    TOTAL VAGINAL HYSTERECTOMY N/A 1997    Home Medications:  Allergies as of 02/13/2024       Reactions   Cefuroxime  Other (See Comments)   Patient states she has extreme abdominal pain and its not really effective for her.   Ace Inhibitors Itching   Codeine Sulfate Itching       Glucosamine    Latex Itching, Other (See Comments)   Gloves    Levofloxacin Other (See Comments)   Gastritis.  Onset 08/03/1999.   Shellfish-derived Products Other (See Comments)   Unknown, showed up on allergy test   Sulfa Antibiotics Itching        Medication List        Accurate as of February 13, 2024  3:03 PM. If you have any questions, ask your nurse or doctor.          acetaminophen  500 MG tablet Commonly known as: TYLENOL  Take 500 mg by mouth every 8 (eight) hours as needed for mild pain.   amLODipine 10 MG tablet Commonly known as: NORVASC Take 10 mg by mouth daily.   azithromycin  250 MG tablet Commonly known as: ZITHROMAX  Take 1 tablet (250 mg total) by mouth daily. Take first 2 tablets together, then 1 every day until finished.   benzonatate  100 MG capsule Commonly known as: TESSALON  Take 1 capsule (100 mg total) by mouth every 8 (eight) hours.   cetirizine 10 MG tablet Commonly known as: ZYRTEC Take 10 mg by mouth daily.   cyanocobalamin 1000 MCG/ML injection Commonly known as: VITAMIN B12 Inject into the muscle.   estradiol 0.5 MG tablet Commonly known as: ESTRACE Take 0.5 mg by mouth daily.   hydrochlorothiazide 12.5 MG capsule Commonly known as: MICROZIDE Take 12.5 mg by mouth daily.   meloxicam 15 MG tablet Commonly known as: MOBIC Take 15 mg by mouth daily.   metoprolol  tartrate 50 MG tablet Commonly known as: LOPRESSOR  Take 50 mg by mouth 2 (two) times daily.   promethazine -dextromethorphan 6.25-15 MG/5ML syrup Commonly known as: PROMETHAZINE -DM Take 5 mLs by mouth at bedtime as needed.   valsartan 320 MG  tablet Commonly known as: DIOVAN Take 320 mg by mouth daily.        Allergies:  Allergies  Allergen Reactions   Cefuroxime  Other (See Comments)    Patient states she has extreme abdominal pain and its not really effective for her.   Ace Inhibitors Itching   Codeine Sulfate Itching         Glucosamine    Latex Itching and Other (See Comments)    Gloves    Levofloxacin Other (See Comments)    Gastritis.  Onset 08/03/1999.   Shellfish-Derived Products Other (See Comments)    Unknown, showed up on allergy test   Sulfa Antibiotics Itching    Family History: Family History  Problem Relation Age of Onset   Breast cancer Daughter  37    Social History:   reports that she has never smoked. She has never used smokeless tobacco. She reports that she does not drink alcohol and does not use drugs.  Physical Exam: BP (!) 168/81 (BP Location: Left Arm, Patient Position: Sitting, Cuff Size: Normal)   Pulse 97   Ht 5' 2 (1.575 m)   Wt 218 lb 12.8 oz (99.2 kg)   SpO2 95%   BMI 40.02 kg/m   Constitutional:  Alert and oriented, no acute distress, nontoxic appearing HEENT: [place], AT Cardiovascular: No clubbing, cyanosis, or edema Respiratory: Normal respiratory effort, no increased work of breathing Skin: No rashes, bruises or suspicious lesions Neurologic: Grossly intact, no focal deficits, moving all 4 extremities Psychiatric: Normal mood and affect  Laboratory Data: Results for orders placed or performed in visit on 02/13/24  Microscopic Examination   Collection Time: 02/13/24  2:55 PM   Urine  Result Value Ref Range   WBC, UA 6-10 (A) 0 - 5 /hpf   RBC, Urine 0-2 0 - 2 /hpf   Epithelial Cells (non renal) >10 (A) 0 - 10 /hpf   Mucus, UA Present (A) Not Estab.   Bacteria, UA Few None seen/Few  Urinalysis, Complete   Collection Time: 02/13/24  2:55 PM  Result Value Ref Range   Specific Gravity, UA 1.020 1.005 - 1.030   pH, UA 6.0 5.0 - 7.5   Color, UA Yellow Yellow    Appearance Ur Clear Clear   Leukocytes,UA Trace (A) Negative   Protein,UA Trace Negative/Trace   Glucose, UA Negative Negative   Ketones, UA Negative Negative   RBC, UA Negative Negative   Bilirubin, UA Negative Negative   Urobilinogen, Ur 0.2 0.2 - 1.0 mg/dL   Nitrite, UA Negative Negative   Microscopic Examination See below:    Pertinent Imaging: KUB, 02/13/2024: See epic  I personally reviewed the images referenced above and note a stable right renal stone.  Assessment & Plan:   1. History of nephrolithiasis (Primary) Stable right renal stone on KUB today, asymptomatic of this.  Will continue to monitor. - Abdomen 1 view (KUB); Future  2. OAB (overactive bladder) UA appears contaminated today, relatively bland in light of this.  Low suspicion for UTI to account for her increased frequency.  I offered her pharmacotherapy, but she prefers to defer this, which is reasonable. - Urinalysis, Complete   Return in about 1 year (around 02/12/2025) for Annual stone visit with KUB prior.  Lucie Hones, PA-C  [facility] [place] [address] [place], [place] [address] [phone]

## 2024-05-01 ENCOUNTER — Other Ambulatory Visit: Payer: Self-pay | Admitting: Internal Medicine

## 2024-05-01 DIAGNOSIS — Z1231 Encounter for screening mammogram for malignant neoplasm of breast: Secondary | ICD-10-CM

## 2024-06-07 ENCOUNTER — Ambulatory Visit
Admission: RE | Admit: 2024-06-07 | Discharge: 2024-06-07 | Disposition: A | Source: Ambulatory Visit | Attending: Internal Medicine | Admitting: Internal Medicine

## 2024-06-07 DIAGNOSIS — Z1231 Encounter for screening mammogram for malignant neoplasm of breast: Secondary | ICD-10-CM

## 2025-02-12 ENCOUNTER — Ambulatory Visit: Admitting: Physician Assistant
